# Patient Record
Sex: Male | Born: 1955 | Race: White | Hispanic: No | Marital: Married | Smoking: Never smoker
Health system: Southern US, Community
[De-identification: ages and names within clinical notes are randomized; demographics above are authoritative.]

## PROBLEM LIST (undated history)

## (undated) DIAGNOSIS — E119 Type 2 diabetes mellitus without complications: Secondary | ICD-10-CM

## (undated) DIAGNOSIS — I10 Essential (primary) hypertension: Secondary | ICD-10-CM

## (undated) DIAGNOSIS — T8859XA Other complications of anesthesia, initial encounter: Secondary | ICD-10-CM

## (undated) DIAGNOSIS — R112 Nausea with vomiting, unspecified: Secondary | ICD-10-CM

## (undated) DIAGNOSIS — Z9889 Other specified postprocedural states: Secondary | ICD-10-CM

## (undated) DIAGNOSIS — M5020 Other cervical disc displacement, unspecified cervical region: Secondary | ICD-10-CM

## (undated) DIAGNOSIS — T4145XA Adverse effect of unspecified anesthetic, initial encounter: Secondary | ICD-10-CM

## (undated) DIAGNOSIS — N200 Calculus of kidney: Secondary | ICD-10-CM

## (undated) HISTORY — DX: Other cervical disc displacement, unspecified cervical region: M50.20

## (undated) HISTORY — PX: KIDNEY STONE SURGERY: SHX686

## (undated) HISTORY — DX: Essential (primary) hypertension: I10

## (undated) HISTORY — PX: TONSILLECTOMY: SUR1361

---

## 2005-04-01 ENCOUNTER — Emergency Department: Payer: Self-pay | Admitting: Emergency Medicine

## 2005-11-26 ENCOUNTER — Emergency Department: Payer: Self-pay | Admitting: Emergency Medicine

## 2006-10-28 HISTORY — PX: SPINE SURGERY: SHX786

## 2006-10-28 HISTORY — PX: BACK SURGERY: SHX140

## 2007-08-10 ENCOUNTER — Ambulatory Visit: Payer: Self-pay | Admitting: Chiropractic Medicine

## 2007-09-04 ENCOUNTER — Ambulatory Visit (HOSPITAL_COMMUNITY): Admission: RE | Admit: 2007-09-04 | Discharge: 2007-09-05 | Payer: Self-pay | Admitting: Neurosurgery

## 2007-09-12 ENCOUNTER — Ambulatory Visit: Payer: Self-pay | Admitting: Internal Medicine

## 2007-11-19 ENCOUNTER — Ambulatory Visit: Payer: Self-pay | Admitting: Neurosurgery

## 2008-01-08 ENCOUNTER — Ambulatory Visit: Payer: Self-pay | Admitting: Internal Medicine

## 2008-04-01 IMAGING — CR DG LUMBAR SPINE 2-3V
1 series · 1 of 1 positions shown · non-contrast
Comparison: none

CLINICAL DATA: L5-S1 disc herniation.
 PORTABLE INTRAOPERATIVE LUMBAR SPINE ? 2 VIEW:

[view not recorded]
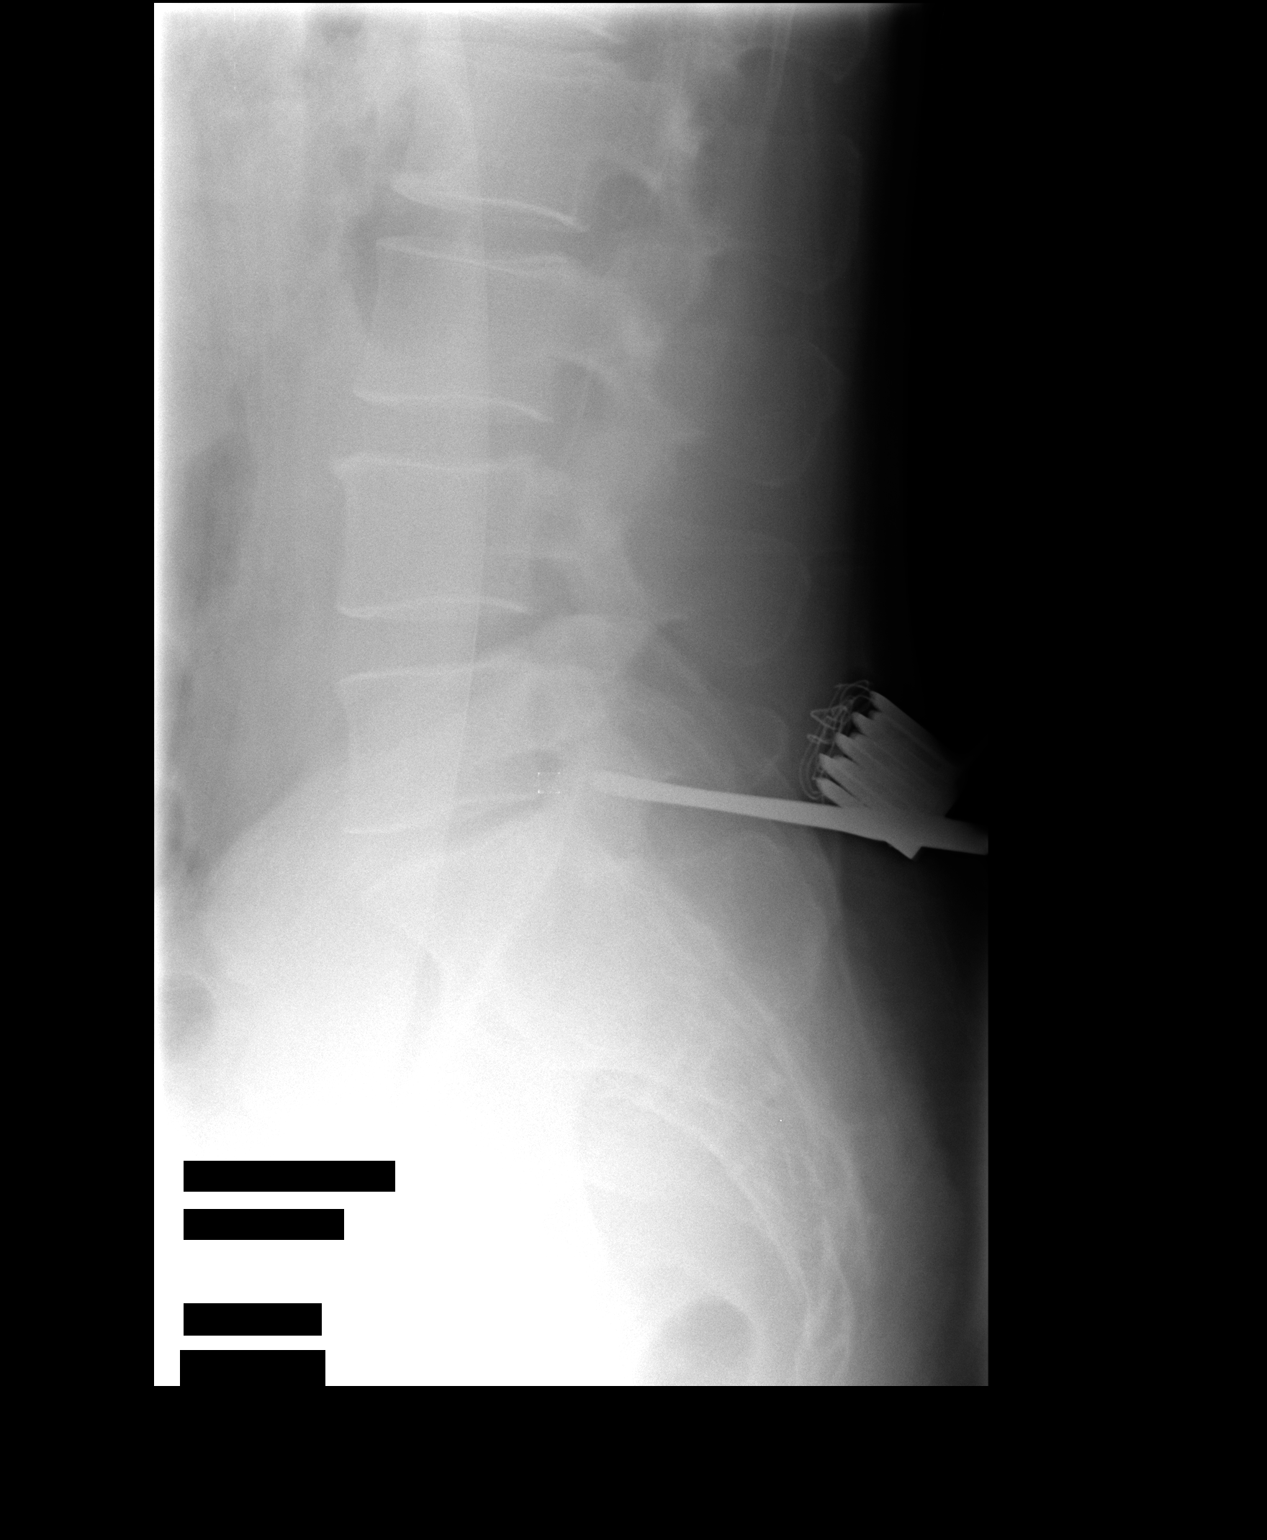

[1 of 1 positions shown; findings below may reference images not displayed]

FINDINGS: Intraoperative crosstable lateral radiograph #1 shows a needle at the interspinous space at the level of L4-5.
 Subsequent intraoperative radiograph #2 shows posterior retractors with a probe overlying the posterior elements at the level of L5-S1.
IMPRESSION: Intraoperative localization of L5-S1.

## 2009-12-16 ENCOUNTER — Ambulatory Visit: Payer: Self-pay | Admitting: Internal Medicine

## 2009-12-29 ENCOUNTER — Ambulatory Visit: Payer: Self-pay | Admitting: Cardiovascular Disease

## 2009-12-29 DIAGNOSIS — I10 Essential (primary) hypertension: Secondary | ICD-10-CM | POA: Insufficient documentation

## 2010-01-01 ENCOUNTER — Telehealth: Payer: Self-pay | Admitting: Cardiovascular Disease

## 2010-01-02 ENCOUNTER — Ambulatory Visit: Payer: Self-pay | Admitting: Cardiovascular Disease

## 2010-01-03 LAB — CONVERTED CEMR LAB
ALT: 47 units/L (ref 0–53)
AST: 28 units/L (ref 0–37)
Albumin: 4.6 g/dL (ref 3.5–5.2)
Alkaline Phosphatase: 93 units/L (ref 39–117)
BUN: 18 mg/dL (ref 6–23)
CO2: 24 meq/L (ref 19–32)
Calcium: 9.3 mg/dL (ref 8.4–10.5)
Chloride: 101 meq/L (ref 96–112)
Cholesterol: 155 mg/dL (ref 0–200)
Creatinine, Ser: 1.04 mg/dL (ref 0.40–1.50)
Glucose, Bld: 81 mg/dL (ref 70–99)
HDL: 36 mg/dL — ABNORMAL LOW (ref >39)
LDL Cholesterol: 88 mg/dL (ref 0–99)
Potassium: 3.9 meq/L (ref 3.5–5.3)
Sodium: 138 meq/L (ref 135–145)
Total Bilirubin: 0.4 mg/dL (ref 0.3–1.2)
Total CHOL/HDL Ratio: 4.3
Total Protein: 7.2 g/dL (ref 6.0–8.3)
Triglycerides: 155 mg/dL — ABNORMAL HIGH (ref <150)
VLDL: 31 mg/dL (ref 0–40)

## 2010-11-27 NOTE — Assessment & Plan Note (Signed)
Summary: NP6/AMD   Visit Type:  New Patient Referring Provider:  self Primary Provider:  Dr Letta Kocher [facility])  CC:  high blood pressure.  History of Present Illness: Pleaseant 55 yo male with history of hypertension, back surgery, urinary tract infection who presents for new patient evaluation for his hypertension.  Mr. Ricky Heath states that he has been doing well, works on the, the third shift. He sleeps from 8 in the morning to noon and then from 6 PM to 10 PM. He is typically very active at work, walks a fair distance. He denies any significant shortness of breath or chest discomfort. He used to work out at [employer] but has not done this in some time. His weight has slowly been increasing as he works sometimes 7 days per week and started to exercise and has fast food.  He is been told by other physicians that his blood pressure is elevated. He has never been on any medication.  Preventive Screening-Counseling & Management  Alcohol-Tobacco     Alcohol drinks/day: 0     Smoking Status: never  Caffeine-Diet-Exercise     Caffeine use/day: no     Does Patient Exercise: yes      Drug Use:  no.    Current Problems (verified): 1)  Unspecified Essential Hypertension  (ICD-401.9)  Current Medications (verified): 1)  None  Allergies (verified): No Known Drug Allergies  Past History:  Past Medical History: Hypertension  Past Surgical History: back surgery in 2008  Family History: adopted  Social History: Full Time Married  Tobacco Use - No.  Alcohol Use - no Regular Exercise - yes Drug Use - no Alcohol drinks/day:  0 Smoking Status:  never Caffeine use/day:  no Does Patient Exercise:  yes Drug Use:  no  Review of Systems  The patient denies fever, weight loss, vision loss, decreased hearing, hoarseness, chest pain, syncope, dyspnea on exertion, peripheral edema, prolonged cough, abdominal pain, incontinence, muscle weakness, depression, and enlarged  lymph nodes.    Vital Signs:  Patient profile:   55 year old male Height:      67 inches Weight:      192 pounds BMI:     30.18 Pulse rate:   84 / minute Pulse rhythm:   regular BP sitting:   162 / 108  (left arm) Cuff size:   regular  Vitals Entered By: Mercer Pod (December 29, 2009 2:21 PM)  Physical Exam  General:  well-appearing middle-aged gentleman in no apparent distress, HEENT exam is benign, oropharynx is clear, neck is supple with no JVP or carotid bruits, heart sounds are regular with normal S1-S2 and no murmurs appreciated, lungs are clear to auscultation with no wheezes Rales, abdominal exam is notable for mild obesity but otherwise benign, no significant lower extremity edema, neurologic exam gross nonfocal and skin is warm and dry.    Impression & Recommendations:  Problem # 1:  UNSPECIFIED ESSENTIAL HYPERTENSION (ICD-401.9) blood pressure has been noted previously by other physicians and confirmed again on today's visit. We have suggested that he start lisinopril HCT 20/12.5 mg daily for one week. If his blood pressure does not improve towards the end of the week, we have suggested that he doubled the dose to 40/25 mg daily. He has a blood pressure cuff at work and his wife is a Teacher, early years/pre and to help with monitoring of his blood pressures.  Preston to call us with his blood pressure numbers over the course of the next  month. We'll see him back in 3 months time for routine followup.  His updated medication list for this problem includes:    Zestoretic 20-12.5 Mg Tabs (Lisinopril-hydrochlorothiazide) .Marland Kitchen... 2 tabs by mouth daily  Problem # 2:  PREVENTIVE HEALTH CARE (ICD-V70.0) we have ordered a cholesterol as this has not been done in some time. He is uncertain of his family history as he was adopted. I suggested that he increase his exercise as he slowly gaining weight. He is not currently a smoker.  Patient Instructions: 1)  Your physician recommends that you  schedule a follow-up appointment in: 3 months 2)  Your physician recommends that you return for a FASTING lipid profile: at your earliest convenience 3)  Your physician has recommended you make the following change in your medication: start lisinopril-hct 40/25 mg- 1 tab daily for 1 week.  If your BP does not regulate, please call. Prescriptions: ZESTORETIC 20-12.5 MG TABS (LISINOPRIL-HYDROCHLOROTHIAZIDE) 2 tabs by mouth daily  #60 x 6   Entered by:   Charlena Cross, RN, BSN   Authorized by:   Dossie Arbour MD   Signed by:   Charlena Cross, RN, BSN on 12/29/2009   Method used:   Electronically to        [facility], [facility] (retail)       [address]       [place], [place]  [address]       Ph: [phone]       Fax: [phone]   RxID:   [id number]

## 2010-11-27 NOTE — Progress Notes (Signed)
Summary: PHI  PHI   Imported By: Harlon Flor 01/01/2010 15:33:56  _____________________________________________________________________  External Attachment:    Type:   Image     Comment:   External Document

## 2010-11-27 NOTE — Progress Notes (Signed)
Summary: PROBLEMS WITH MEDS  Phone Note Call from Patient Call back at Home Phone [phone]   Caller: SELF Call For: Ricky Heath Summary of Call: SINCE STARTING THE LISINOPRIL-FEELS WEAK AND TIRED-PT COMING LATER TODAY FOR LABS AND WOULD JUST LIKE TO BE TALKED TO THEN ABOUT IT-PLEASE DO NOT CALL HIM BECAUSE HE JUST GOT IN FROM WORK AND IS GOING TO BED Initial call taken by: Harlon Flor,  January 01, 2010 9:00 AM  Follow-up for Phone Call        half linsinopril hctz per dr Mariah Milling Follow-up by: Charlena Cross, RN, BSN,  January 01, 2010 3:54 PM

## 2011-01-03 ENCOUNTER — Ambulatory Visit: Payer: Self-pay | Admitting: Family Medicine

## 2011-03-12 NOTE — Op Note (Signed)
NAME:  Ricky Heath, Ricky Heath                 ACCOUNT NO.:  [id number]   MEDICAL RECORD NO.:  [id number]          PATIENT TYPE:  AMB   LOCATION:  SDS                          FACILITY:  [facility]   PHYSICIAN:  Coletta Memos, M.D.     DATE OF BIRTH:  1956/02/07   DATE OF PROCEDURE:  09/04/2007  DATE OF DISCHARGE:                               OPERATIVE REPORT   PREOPERATIVE DIAGNOSIS:  Displaced disk, right L5-S1.   POSTOPERATIVE DIAGNOSIS:  Displaced disk, right L5-S1.   PROCEDURE:  Right L5-S1 semi-hemilaminectomy and diskectomy with  microdissection.   COMPLICATIONS:  None.   SURGEON:  Coletta Memos, M.D.   ASSISTANT:  None.   ANESTHESIA:  General endotracheal.   INDICATIONS:  [name] presented with right lower extremity pain.  He  had a large disk herniation on the right side at L5-S1.   OPERATIVE NOTE:  [name] is brought to the operating room, intubated,  and placed under general anesthetic without difficulty.  He was placed  on a Wilson frame supine, all pressure points being properly padded.  His back was prepped.  He was draped in a sterile fashion.  I  infiltrated 10 mL half percent lidocaine with 1:200,000 strength  epinephrine into the lumbar region.  I opened the skin with a #10 blade,  took this down to the thoracolumbar fascia.  I then exposed the lamina  of L5-S1.  This was confirmed with intraoperative x-ray.  I performed  the semi-hemilaminectomy using a high-speed drill and then removed  ligamentum flavum to expose the thecal sac and the right S1 nerve root.  I retracted that medially and then used bipolar cautery to cauterize  epidural veins.  I then divided those sharply.  I retracted the right S1  nerve root medially and then opened the disk space with a #15 blade.  I  then proceeded to perform a diskectomy.  The sheath of the nerve root  appeared to be opened.  There was no spinal fluid.  The nerve roots  which was visible in the microscope were intact.  I,  therefore,  discontinued to protect that area with the nerve root retractor and  proceeded with the diskectomy.  I performed a thorough decompression  using microdissection.  When that was done, I placed a small piece of  Duragen over the neural sheath opening.  I then closed the wound in  layered fashion using Vicryl sutures after ensuring that I had a good  decompression of the nerve root.  Closed the wound using Vicryl sutures  to reapproximate the thoracolumbar fascia, subcutaneous and subcuticular  layers.  Dermabond used for sterile dressing.  The patient was extubated  after being turned supine.  The patient was turned prone onto a Wilson  frame.          ______________________________  Coletta Memos, M.D.    KC/MEDQ  D:  09/04/2007  T:  09/05/2007  Job:  [id number]

## 2011-05-14 ENCOUNTER — Encounter: Payer: Self-pay | Admitting: Cardiovascular Disease

## 2011-07-12 ENCOUNTER — Ambulatory Visit: Payer: Self-pay | Admitting: Family Medicine

## 2011-08-06 LAB — BASIC METABOLIC PANEL
BUN: 14
CO2: 24
Calcium: 9.6
Chloride: 106
Creatinine, Ser: 0.95
GFR calc Af Amer: >60
GFR calc non Af Amer: >60
Glucose, Bld: 121 — ABNORMAL HIGH
Potassium: 4.1
Sodium: 135

## 2011-08-06 LAB — CBC
HCT: 49.3
Hemoglobin: 16.9
MCHC: 34.2
MCV: 92.8
Platelets: 317
RBC: 5.31
RDW: 12.6
WBC: 8.5

## 2011-08-29 ENCOUNTER — Other Ambulatory Visit (HOSPITAL_COMMUNITY): Payer: Managed Care, Other (non HMO)

## 2011-09-10 ENCOUNTER — Ambulatory Visit (HOSPITAL_COMMUNITY)
Admission: RE | Admit: 2011-09-10 | Payer: Managed Care, Other (non HMO) | Source: Ambulatory Visit | Admitting: Neurosurgery

## 2011-09-10 ENCOUNTER — Encounter (HOSPITAL_COMMUNITY): Admission: RE | Payer: Self-pay | Source: Ambulatory Visit

## 2011-09-10 SURGERY — ANTERIOR CERVICAL DECOMPRESSION/DISCECTOMY FUSION 1 LEVEL
Anesthesia: General

## 2011-10-23 ENCOUNTER — Ambulatory Visit: Payer: Self-pay | Admitting: Internal Medicine

## 2012-03-17 ENCOUNTER — Encounter: Payer: Self-pay | Admitting: Internal Medicine

## 2012-03-17 ENCOUNTER — Ambulatory Visit (INDEPENDENT_AMBULATORY_CARE_PROVIDER_SITE_OTHER): Payer: Managed Care, Other (non HMO) | Admitting: Internal Medicine

## 2012-03-17 VITALS — BP 164/102 | HR 74 | Temp 98.5°F | Resp 16 | Ht 67.0 in | Wt 205.5 lb

## 2012-03-17 DIAGNOSIS — R059 Cough, unspecified: Secondary | ICD-10-CM

## 2012-03-17 DIAGNOSIS — R05 Cough: Secondary | ICD-10-CM

## 2012-03-17 DIAGNOSIS — E669 Obesity, unspecified: Secondary | ICD-10-CM

## 2012-03-17 DIAGNOSIS — I10 Essential (primary) hypertension: Secondary | ICD-10-CM | POA: Insufficient documentation

## 2012-03-17 MED ORDER — AMLODIPINE BESYLATE 5 MG PO TABS: 5.0000 mg | ORAL_TABLET | Freq: Every day | ORAL | Status: DC

## 2012-03-17 MED ORDER — OMEPRAZOLE 40 MG PO CPDR: 40.0000 mg | DELAYED_RELEASE_CAPSULE | Freq: Every day | ORAL | Status: AC

## 2012-03-17 NOTE — Progress Notes (Signed)
Patient ID: Ricky Heath, male   DOB: 08/05/1956, 56 y.o.   MRN: [id number] Patient Active Problem List  Diagnoses  . UNSPECIFIED ESSENTIAL HYPERTENSION  . Cough  . Hypertension  . Obesity (BMI 30-39.9)    Subjective:  CC:   Chief Complaint  Patient presents with  . New Patient    HPI:   Ricky Heath a 56 y.o. male who presents as a new patient.  History of obesity and hypertension both managed last year with weight loss via [employer]  but has regained his weight and is now hypertensive again, in need of medication.  Hasn't had regular health care or a physical in 24 years.  He reports a chronic daily cough which has been present for years and has had no prior chest x ray. It is accompanied by hoarseness.  He has had no prior ENT evaluation, or EGD/colonoscopy. .  Bowel habits have been normal.   Past Medical History  Diagnosis Date  . HTN (hypertension)     Past Surgical History  Procedure Date  . Back surgery 2008   . Spine surgery 2008    L5 herniated disk repair, [place]  . Tonsillectomy          The following portions of the patient's history were reviewed and updated as appropriate: Allergies, current medications, and problem list.    Review of Systems:   12 Pt  review of systems was negative except those addressed in the HPI,     History   Social History  . Marital Status: Married    Spouse Name: N/A    Number of Children: N/A  . Years of Education: N/A   Occupational History  . Not on file.   Social History Main Topics  . Smoking status: Never Smoker   . Smokeless tobacco: Never Used  . Alcohol Use: No  . Drug Use: No  . Sexually Active: Not on file   Other Topics Concern  . Not on file   Social History Narrative   Married; full time; gets regular exercise. Pt is adopted (no family hx)    Objective:  BP 164/102  Pulse 74  Temp(Src) 98.5 F (36.9 C) (Oral)  Resp 16  Ht 5\' 7"  (1.702 m)  Wt 205 lb 8 oz (93.214 kg)  BMI  32.19 kg/m2  SpO2 94%  General appearance: alert, cooperative and appears stated age Ears: normal TM's and external ear canals both ears Throat: lips, mucosa, and tongue normal; teeth and gums normal Neck: no adenopathy, no carotid bruit, supple, symmetrical, trachea midline and thyroid not enlarged, symmetric, no tenderness/mass/nodules Back: symmetric, no curvature. ROM normal. No CVA tenderness. Lungs: clear to auscultation bilaterally Heart: regular rate and rhythm, S1, S2 normal, no murmur, click, rub or gallop Abdomen: soft, non-tender; bowel sounds normal; no masses,  no organomegaly Pulses: 2+ and symmetric Skin: Skin color, texture, turgor normal. No rashes or lesions Lymph nodes: Cervical, supraclavicular, and axillary nodes normal.  Assessment and Plan:  Cough I have recommended a trial of PPI for empiric treatment of reflux,  If the cough resolves, he will need an EGD given his several years history of cough. CXR also ordered.   Hypertension Trial of amlodipine pending evaluation of kidney function and other medical issues.   Obesity (BMI 30-39.9) I have addressed  BMI and recommended a low glycemic index diet utilizing smaller more frequent meals to increase metabolism.  I have also recommended that patient start exercising with a goal  of 30 minutes of aerobic exercise a minimum of 5 days per week. Screening for lipid disorders, thyroid and diabetes to be done today.       Updated Medication List Outpatient Encounter Prescriptions as of 03/17/2012  Medication Sig Dispense Refill  . amLODipine (NORVASC) 5 MG tablet Take 1 tablet (5 mg total) by mouth daily.  30 tablet  3  . omeprazole (PRILOSEC) 40 MG capsule Take 1 capsule (40 mg total) by mouth daily.  30 capsule  3  . DISCONTD: lisinopril-hydrochlorothiazide (PRINZIDE,ZESTORETIC) 20-12.5 MG per tablet Take 2 tablets by mouth daily.           No orders of the defined types were placed in this encounter.    No  Follow-up on file.

## 2012-03-17 NOTE — Patient Instructions (Signed)
I am starting you on a medicine for reflux (omeprazole) and blood pressure (amlodipine)  Take both one time daily every morning  I will get you scheduled to see a GI doctor for your EGD and colonoscopy   We will get fasting labs prior your physical with me (scheudle both for an afternoon so you can fast while you sleep)  Fasting means 8 hours with only water to eat or drink

## 2012-03-18 DIAGNOSIS — E669 Obesity, unspecified: Secondary | ICD-10-CM | POA: Insufficient documentation

## 2012-03-18 NOTE — Assessment & Plan Note (Signed)
I have addressed  BMI and recommended a low glycemic index diet utilizing smaller more frequent meals to increase metabolism.  I have also recommended that patient start exercising with a goal of 30 minutes of aerobic exercise a minimum of 5 days per week. Screening for lipid disorders, thyroid and diabetes to be done today.   

## 2012-03-18 NOTE — Assessment & Plan Note (Signed)
I have recommended a trial of PPI for empiric treatment of reflux,  If the cough resolves, he will need an EGD given his several years history of cough. CXR also ordered.

## 2012-03-18 NOTE — Assessment & Plan Note (Signed)
Trial of amlodipine pending evaluation of kidney function and other medical issues.

## 2012-03-19 ENCOUNTER — Telehealth: Payer: Self-pay | Admitting: Internal Medicine

## 2012-03-19 NOTE — Telephone Encounter (Signed)
Patient called and stated since starting both the omeprazole and amlodipine he has developed a headache on his left side.  He didn't know which one is causing the headache and wanted to know what he should do.  Please advise.

## 2012-03-19 NOTE — Telephone Encounter (Signed)
Patient notified.  He will call back on Tuesday to let us know.

## 2012-03-19 NOTE — Telephone Encounter (Signed)
Have his blood pressure checked to make sure it is not too low (only stopp amlodipine if bp is less than 100/600.  Otherwise stop the omeprazole for a few days to see if it is the omeprazole.

## 2012-11-02 ENCOUNTER — Ambulatory Visit: Payer: Self-pay | Admitting: Emergency Medicine

## 2013-05-14 ENCOUNTER — Ambulatory Visit: Payer: Self-pay | Admitting: Family Medicine

## 2013-05-14 LAB — URINALYSIS, COMPLETE
Bacteria: NEGATIVE
Bilirubin,UR: NEGATIVE
Glucose,UR: NEGATIVE mg/dL (ref 0–75)
Ketone: NEGATIVE
Leukocyte Esterase: NEGATIVE
Nitrite: NEGATIVE
Ph: 6 (ref 4.5–8.0)
Specific Gravity: 1.025 (ref 1.003–1.030)

## 2013-05-16 LAB — URINE CULTURE

## 2013-05-23 ENCOUNTER — Emergency Department: Payer: Self-pay | Admitting: Emergency Medicine

## 2013-05-23 LAB — COMPREHENSIVE METABOLIC PANEL
Albumin: 3.8 g/dL (ref 3.4–5.0)
Alkaline Phosphatase: 123 U/L (ref 50–136)
Anion Gap: 5 — ABNORMAL LOW (ref 7–16)
BUN: 19 mg/dL — ABNORMAL HIGH (ref 7–18)
Bilirubin,Total: 0.7 mg/dL (ref 0.2–1.0)
Calcium, Total: 9.1 mg/dL (ref 8.5–10.1)
Chloride: 103 mmol/L (ref 98–107)
Co2: 29 mmol/L (ref 21–32)
Creatinine: 1.82 mg/dL — ABNORMAL HIGH (ref 0.60–1.30)
EGFR (African American): 48 — ABNORMAL LOW
EGFR (Non-African Amer.): 41 — ABNORMAL LOW
Glucose: 87 mg/dL (ref 65–99)
Osmolality: 275 (ref 275–301)
Potassium: 3.8 mmol/L (ref 3.5–5.1)
SGOT(AST): 24 U/L (ref 15–37)
SGPT (ALT): 50 U/L (ref 12–78)
Sodium: 137 mmol/L (ref 136–145)
Total Protein: 8.1 g/dL (ref 6.4–8.2)

## 2013-05-23 LAB — CBC WITH DIFFERENTIAL/PLATELET
Basophil #: 0.1 10*3/uL (ref 0.0–0.1)
Basophil %: 0.6 %
Eosinophil #: 0.2 10*3/uL (ref 0.0–0.7)
Eosinophil %: 1.3 %
HCT: 49.2 % (ref 40.0–52.0)
HGB: 16.2 g/dL (ref 13.0–18.0)
Lymphocyte #: 3.4 10*3/uL (ref 1.0–3.6)
Lymphocyte %: 22.6 %
MCH: 30.2 pg (ref 26.0–34.0)
MCHC: 33 g/dL (ref 32.0–36.0)
MCV: 92 fL (ref 80–100)
Monocyte #: 2 x10 3/mm — ABNORMAL HIGH (ref 0.2–1.0)
Monocyte %: 13.6 %
Neutrophil #: 9.3 10*3/uL — ABNORMAL HIGH (ref 1.4–6.5)
Neutrophil %: 61.9 %
Platelet: 312 10*3/uL (ref 150–440)
RBC: 5.37 10*6/uL (ref 4.40–5.90)
RDW: 12.6 % (ref 11.5–14.5)
WBC: 15 10*3/uL — ABNORMAL HIGH (ref 3.8–10.6)

## 2013-05-23 LAB — URINALYSIS, COMPLETE
Bilirubin,UR: NEGATIVE
Glucose,UR: NEGATIVE mg/dL (ref 0–75)
Leukocyte Esterase: NEGATIVE
Nitrite: NEGATIVE
Ph: 6 (ref 4.5–8.0)
Protein: NEGATIVE
RBC,UR: 9 /HPF (ref 0–5)
Specific Gravity: 1.015 (ref 1.003–1.030)
Squamous Epithelial: NONE SEEN
WBC UR: 1 /HPF (ref 0–5)

## 2013-05-24 ENCOUNTER — Ambulatory Visit: Payer: Self-pay | Admitting: Urology

## 2013-05-24 DIAGNOSIS — N138 Other obstructive and reflux uropathy: Secondary | ICD-10-CM | POA: Insufficient documentation

## 2013-05-24 DIAGNOSIS — N200 Calculus of kidney: Secondary | ICD-10-CM | POA: Insufficient documentation

## 2013-05-24 DIAGNOSIS — N1330 Unspecified hydronephrosis: Secondary | ICD-10-CM | POA: Insufficient documentation

## 2013-05-24 DIAGNOSIS — N23 Unspecified renal colic: Secondary | ICD-10-CM | POA: Insufficient documentation

## 2013-05-24 DIAGNOSIS — N401 Enlarged prostate with lower urinary tract symptoms: Secondary | ICD-10-CM | POA: Insufficient documentation

## 2013-06-01 ENCOUNTER — Ambulatory Visit: Payer: Self-pay | Admitting: Urology

## 2013-09-14 ENCOUNTER — Ambulatory Visit: Payer: Self-pay | Admitting: Emergency Medicine

## 2014-06-01 ENCOUNTER — Encounter: Payer: Self-pay | Admitting: Orthopedic Surgery

## 2014-06-10 ENCOUNTER — Ambulatory Visit: Payer: Self-pay | Admitting: Family Medicine

## 2014-06-24 ENCOUNTER — Ambulatory Visit: Payer: Self-pay | Admitting: Neurosurgery

## 2014-06-28 ENCOUNTER — Other Ambulatory Visit: Payer: Self-pay | Admitting: Neurosurgery

## 2014-06-29 ENCOUNTER — Encounter (HOSPITAL_COMMUNITY): Payer: Self-pay | Admitting: *Deleted

## 2014-06-29 NOTE — Progress Notes (Signed)
Pre-op orders in EPIC, not signed. Called and spoke with Darl Pikes at Dr. Sueanne Margarita office to have Dr. Franky Macho sign orders.

## 2014-06-29 NOTE — Progress Notes (Signed)
Pt does not have a PCP at the present time. Hx of Hypertension, has been off meds for over a year because of weight loss. States he's gained the weight back and needs to get a PCP.

## 2014-06-30 ENCOUNTER — Ambulatory Visit (HOSPITAL_COMMUNITY): Payer: Managed Care, Other (non HMO)

## 2014-06-30 ENCOUNTER — Encounter (HOSPITAL_COMMUNITY): Payer: Managed Care, Other (non HMO) | Admitting: Anesthesiology

## 2014-06-30 ENCOUNTER — Ambulatory Visit (HOSPITAL_COMMUNITY): Payer: Managed Care, Other (non HMO) | Admitting: Anesthesiology

## 2014-06-30 ENCOUNTER — Encounter (HOSPITAL_COMMUNITY): Payer: Self-pay | Admitting: Anesthesiology

## 2014-06-30 ENCOUNTER — Encounter (HOSPITAL_COMMUNITY)
Admission: RE | Disposition: A | Discharge status: Home / Self Care | Payer: Managed Care, Other (non HMO) | Source: Ambulatory Visit | Attending: Neurosurgery

## 2014-06-30 ENCOUNTER — Ambulatory Visit (HOSPITAL_COMMUNITY)
Admission: RE | Admit: 2014-06-30 | Discharge: 2014-07-01 | Disposition: A | Discharge status: Home / Self Care | Payer: Managed Care, Other (non HMO) | Source: Ambulatory Visit | Attending: Neurosurgery | Admitting: Neurosurgery

## 2014-06-30 DIAGNOSIS — I10 Essential (primary) hypertension: Secondary | ICD-10-CM | POA: Insufficient documentation

## 2014-06-30 DIAGNOSIS — M5126 Other intervertebral disc displacement, lumbar region: Secondary | ICD-10-CM | POA: Insufficient documentation

## 2014-06-30 HISTORY — DX: Nausea with vomiting, unspecified: R11.2

## 2014-06-30 HISTORY — PX: LUMBAR LAMINECTOMY/DECOMPRESSION MICRODISCECTOMY: SHX5026

## 2014-06-30 HISTORY — DX: Adverse effect of unspecified anesthetic, initial encounter: T41.45XA

## 2014-06-30 HISTORY — DX: Other specified postprocedural states: Z98.890

## 2014-06-30 HISTORY — DX: Calculus of kidney: N20.0

## 2014-06-30 HISTORY — DX: Other complications of anesthesia, initial encounter: T88.59XA

## 2014-06-30 LAB — CBC
HCT: 50.9 % (ref 39.0–52.0)
Hemoglobin: 17.5 g/dL — ABNORMAL HIGH (ref 13.0–17.0)
MCH: 31.9 pg (ref 26.0–34.0)
MCHC: 34.4 g/dL (ref 30.0–36.0)
MCV: 92.9 fL (ref 78.0–100.0)
Platelets: 272 10*3/uL (ref 150–400)
RBC: 5.48 MIL/uL (ref 4.22–5.81)
RDW: 12.7 % (ref 11.5–15.5)
WBC: 8.7 10*3/uL (ref 4.0–10.5)

## 2014-06-30 LAB — SURGICAL PCR SCREEN
MRSA, PCR: NEGATIVE
Staphylococcus aureus: POSITIVE — AB

## 2014-06-30 LAB — BASIC METABOLIC PANEL
Anion gap: 12 (ref 5–15)
BUN: 17 mg/dL (ref 6–23)
CO2: 28 mEq/L (ref 19–32)
Calcium: 9.6 mg/dL (ref 8.4–10.5)
Chloride: 102 mEq/L (ref 96–112)
Creatinine, Ser: 1.07 mg/dL (ref 0.50–1.35)
GFR calc Af Amer: 88 mL/min — ABNORMAL LOW (ref >90)
GFR calc non Af Amer: 76 mL/min — ABNORMAL LOW (ref >90)
Glucose, Bld: 166 mg/dL — ABNORMAL HIGH (ref 70–99)
Potassium: 4.4 mEq/L (ref 3.7–5.3)
Sodium: 142 mEq/L (ref 137–147)

## 2014-06-30 SURGERY — LUMBAR LAMINECTOMY/DECOMPRESSION MICRODISCECTOMY 1 LEVEL
Anesthesia: General | Site: Spine Lumbar

## 2014-06-30 MED ORDER — GLYCOPYRROLATE 0.2 MG/ML IJ SOLN
INTRAMUSCULAR | Status: AC
  Filled 2014-06-30: qty 4

## 2014-06-30 MED ORDER — MIDAZOLAM HCL 2 MG/2ML IJ SOLN
INTRAMUSCULAR | Status: AC
  Filled 2014-06-30: qty 2

## 2014-06-30 MED ORDER — ROCURONIUM BROMIDE 100 MG/10ML IV SOLN
INTRAVENOUS | Status: DC | PRN
  Administered 2014-06-30: 35 mg via INTRAVENOUS

## 2014-06-30 MED ORDER — SENNOSIDES-DOCUSATE SODIUM 8.6-50 MG PO TABS
1.0000 | ORAL_TABLET | Freq: Every evening | ORAL | Status: DC | PRN
  Filled 2014-06-30: qty 1

## 2014-06-30 MED ORDER — AMLODIPINE BESYLATE 5 MG PO TABS
5.0000 mg | ORAL_TABLET | Freq: Every day | ORAL | Status: DC
  Administered 2014-06-30 – 2014-07-01 (×2): 5 mg via ORAL
  Filled 2014-06-30 (×2): qty 1

## 2014-06-30 MED ORDER — LABETALOL HCL 5 MG/ML IV SOLN
5.0000 mg | Freq: Once | INTRAVENOUS | Status: AC
  Administered 2014-06-30: 5 mg via INTRAVENOUS

## 2014-06-30 MED ORDER — SUCCINYLCHOLINE CHLORIDE 20 MG/ML IJ SOLN
INTRAMUSCULAR | Status: DC | PRN
  Administered 2014-06-30: 140 mg via INTRAVENOUS

## 2014-06-30 MED ORDER — ONDANSETRON HCL 4 MG/2ML IJ SOLN
INTRAMUSCULAR | Status: DC | PRN
  Administered 2014-06-30: 4 mg via INTRAVENOUS

## 2014-06-30 MED ORDER — LABETALOL HCL 5 MG/ML IV SOLN
5.0000 mg | Freq: Once | INTRAVENOUS | Status: DC
Start: 2014-06-30 — End: 2014-07-01
  Filled 2014-06-30: qty 4

## 2014-06-30 MED ORDER — GLYCOPYRROLATE 0.2 MG/ML IJ SOLN
INTRAMUSCULAR | Status: DC | PRN
  Administered 2014-06-30: .8 mg via INTRAVENOUS

## 2014-06-30 MED ORDER — OXYCODONE-ACETAMINOPHEN 5-325 MG PO TABS
1.0000 | ORAL_TABLET | ORAL | Status: DC | PRN
  Administered 2014-06-30: 2 via ORAL

## 2014-06-30 MED ORDER — SUCCINYLCHOLINE CHLORIDE 20 MG/ML IJ SOLN
INTRAMUSCULAR | Status: AC
  Filled 2014-06-30: qty 1

## 2014-06-30 MED ORDER — SODIUM CHLORIDE 0.9 % IJ SOLN
INTRAMUSCULAR | Status: AC
  Filled 2014-06-30: qty 10

## 2014-06-30 MED ORDER — PANTOPRAZOLE SODIUM 40 MG PO TBEC
80.0000 mg | DELAYED_RELEASE_TABLET | Freq: Every day | ORAL | Status: DC
  Administered 2014-06-30 – 2014-07-01 (×2): 80 mg via ORAL
  Filled 2014-06-30 (×2): qty 2

## 2014-06-30 MED ORDER — LIDOCAINE-EPINEPHRINE 0.5 %-1:200000 IJ SOLN
INTRAMUSCULAR | Status: DC | PRN
  Administered 2014-06-30 (×2): 10 mL

## 2014-06-30 MED ORDER — PROPOFOL 10 MG/ML IV BOLUS
INTRAVENOUS | Status: AC
  Filled 2014-06-30: qty 20

## 2014-06-30 MED ORDER — PHENYLEPHRINE HCL 10 MG/ML IJ SOLN
INTRAMUSCULAR | Status: DC | PRN
  Administered 2014-06-30: 120 ug via INTRAVENOUS
  Administered 2014-06-30: 80 ug via INTRAVENOUS

## 2014-06-30 MED ORDER — HYDROCODONE-ACETAMINOPHEN 5-325 MG PO TABS
1.0000 | ORAL_TABLET | ORAL | Status: DC | PRN
  Administered 2014-06-30 – 2014-07-01 (×2): 2 via ORAL
  Filled 2014-06-30 (×2): qty 2

## 2014-06-30 MED ORDER — OXYCODONE-ACETAMINOPHEN 5-325 MG PO TABS
ORAL_TABLET | ORAL | Status: AC
  Administered 2014-06-30: 2 via ORAL
  Filled 2014-06-30: qty 2

## 2014-06-30 MED ORDER — ACETAMINOPHEN 650 MG RE SUPP: 650.0000 mg | RECTAL | Status: DC | PRN

## 2014-06-30 MED ORDER — EPHEDRINE SULFATE 50 MG/ML IJ SOLN
INTRAMUSCULAR | Status: AC
  Filled 2014-06-30: qty 1

## 2014-06-30 MED ORDER — ONDANSETRON HCL 4 MG/2ML IJ SOLN
INTRAMUSCULAR | Status: AC
  Filled 2014-06-30: qty 2

## 2014-06-30 MED ORDER — SODIUM CHLORIDE 0.9 % IJ SOLN
3.0000 mL | Freq: Two times a day (BID) | INTRAMUSCULAR | Status: DC
  Administered 2014-06-30: 3 mL via INTRAVENOUS

## 2014-06-30 MED ORDER — NEOSTIGMINE METHYLSULFATE 10 MG/10ML IV SOLN
INTRAVENOUS | Status: DC | PRN
  Administered 2014-06-30: 4 mg via INTRAVENOUS

## 2014-06-30 MED ORDER — NEOSTIGMINE METHYLSULFATE 10 MG/10ML IV SOLN
INTRAVENOUS | Status: AC
  Filled 2014-06-30: qty 1

## 2014-06-30 MED ORDER — PROMETHAZINE HCL 25 MG PO TABS: 12.5000 mg | ORAL_TABLET | Freq: Four times a day (QID) | ORAL | Status: DC | PRN

## 2014-06-30 MED ORDER — POTASSIUM CHLORIDE IN NACL 20-0.9 MEQ/L-% IV SOLN
INTRAVENOUS | Status: DC
  Filled 2014-06-30 (×3): qty 1000

## 2014-06-30 MED ORDER — SENNA 8.6 MG PO TABS
1.0000 | ORAL_TABLET | Freq: Two times a day (BID) | ORAL | Status: DC
  Administered 2014-06-30 – 2014-07-01 (×3): 8.6 mg via ORAL
  Filled 2014-06-30 (×5): qty 1

## 2014-06-30 MED ORDER — MIDAZOLAM HCL 5 MG/5ML IJ SOLN
INTRAMUSCULAR | Status: DC | PRN
  Administered 2014-06-30: 2 mg via INTRAVENOUS

## 2014-06-30 MED ORDER — ROCURONIUM BROMIDE 50 MG/5ML IV SOLN
INTRAVENOUS | Status: AC
  Filled 2014-06-30: qty 1

## 2014-06-30 MED ORDER — DOCUSATE SODIUM 100 MG PO CAPS
100.0000 mg | ORAL_CAPSULE | Freq: Two times a day (BID) | ORAL | Status: DC
  Administered 2014-06-30 – 2014-07-01 (×3): 100 mg via ORAL
  Filled 2014-06-30 (×4): qty 1

## 2014-06-30 MED ORDER — SODIUM CHLORIDE 0.9 % IV SOLN: 250.0000 mL | INTRAVENOUS | Status: DC

## 2014-06-30 MED ORDER — LACTATED RINGERS IV SOLN
INTRAVENOUS | Status: DC | PRN
  Administered 2014-06-30 (×2): via INTRAVENOUS

## 2014-06-30 MED ORDER — PROPOFOL 10 MG/ML IV BOLUS
INTRAVENOUS | Status: DC | PRN
  Administered 2014-06-30: 200 mg via INTRAVENOUS

## 2014-06-30 MED ORDER — KETOROLAC TROMETHAMINE 30 MG/ML IJ SOLN
30.0000 mg | Freq: Four times a day (QID) | INTRAMUSCULAR | Status: DC
Start: 2014-06-30 — End: 2014-07-01
  Administered 2014-06-30 – 2014-07-01 (×4): 30 mg via INTRAVENOUS
  Filled 2014-06-30 (×7): qty 1

## 2014-06-30 MED ORDER — FENTANYL CITRATE 0.05 MG/ML IJ SOLN
INTRAMUSCULAR | Status: AC
  Filled 2014-06-30: qty 5

## 2014-06-30 MED ORDER — LIDOCAINE HCL (CARDIAC) 20 MG/ML IV SOLN
INTRAVENOUS | Status: DC | PRN
  Administered 2014-06-30: 100 mg via INTRAVENOUS

## 2014-06-30 MED ORDER — CEFAZOLIN SODIUM-DEXTROSE 2-3 GM-% IV SOLR
INTRAVENOUS | Status: AC
  Administered 2014-06-30: 2 g via INTRAVENOUS
  Filled 2014-06-30: qty 50

## 2014-06-30 MED ORDER — ACETAMINOPHEN 325 MG PO TABS: 650.0000 mg | ORAL_TABLET | ORAL | Status: DC | PRN

## 2014-06-30 MED ORDER — HYDROMORPHONE HCL PF 1 MG/ML IJ SOLN
INTRAMUSCULAR | Status: AC
  Administered 2014-06-30: 0.5 mg via INTRAVENOUS
  Filled 2014-06-30: qty 2

## 2014-06-30 MED ORDER — TAMSULOSIN HCL 0.4 MG PO CAPS
0.4000 mg | ORAL_CAPSULE | Freq: Every day | ORAL | Status: DC
  Administered 2014-06-30 – 2014-07-01 (×2): 0.4 mg via ORAL
  Filled 2014-06-30 (×2): qty 1

## 2014-06-30 MED ORDER — MENTHOL 3 MG MT LOZG
1.0000 | LOZENGE | OROMUCOSAL | Status: DC | PRN
Start: 2014-06-30 — End: 2014-07-01

## 2014-06-30 MED ORDER — ARTIFICIAL TEARS OP OINT
TOPICAL_OINTMENT | OPHTHALMIC | Status: AC
  Filled 2014-06-30: qty 3.5

## 2014-06-30 MED ORDER — LACTATED RINGERS IV SOLN: INTRAVENOUS | Status: DC

## 2014-06-30 MED ORDER — SODIUM CHLORIDE 0.9 % IR SOLN
Status: DC | PRN
  Administered 2014-06-30: 1

## 2014-06-30 MED ORDER — SCOPOLAMINE 1 MG/3DAYS TD PT72
MEDICATED_PATCH | TRANSDERMAL | Status: AC
  Filled 2014-06-30: qty 1

## 2014-06-30 MED ORDER — HYDROMORPHONE HCL PF 1 MG/ML IJ SOLN
0.2500 mg | INTRAMUSCULAR | Status: DC | PRN
  Administered 2014-06-30 (×4): 0.5 mg via INTRAVENOUS

## 2014-06-30 MED ORDER — KETOROLAC TROMETHAMINE 30 MG/ML IJ SOLN
INTRAMUSCULAR | Status: AC
  Administered 2014-06-30: 30 mg via INTRAVENOUS
  Filled 2014-06-30: qty 1

## 2014-06-30 MED ORDER — METOCLOPRAMIDE HCL 5 MG/ML IJ SOLN
INTRAMUSCULAR | Status: AC
  Filled 2014-06-30: qty 2

## 2014-06-30 MED ORDER — FENTANYL CITRATE 0.05 MG/ML IJ SOLN
INTRAMUSCULAR | Status: DC | PRN
  Administered 2014-06-30: 200 ug via INTRAVENOUS
  Administered 2014-06-30 (×2): 50 ug via INTRAVENOUS

## 2014-06-30 MED ORDER — THROMBIN 5000 UNITS EX SOLR
CUTANEOUS | Status: DC | PRN
  Administered 2014-06-30: 5000 [IU] via TOPICAL

## 2014-06-30 MED ORDER — CLONIDINE HCL 0.1 MG PO TABS
0.1000 mg | ORAL_TABLET | Freq: Three times a day (TID) | ORAL | Status: DC
  Administered 2014-06-30 – 2014-07-01 (×2): 0.1 mg via ORAL
  Filled 2014-06-30 (×4): qty 1

## 2014-06-30 MED ORDER — LABETALOL HCL 5 MG/ML IV SOLN
INTRAVENOUS | Status: AC
  Administered 2014-06-30: 5 mg via INTRAVENOUS
  Filled 2014-06-30: qty 4

## 2014-06-30 MED ORDER — HEMOSTATIC AGENTS (NO CHARGE) OPTIME
TOPICAL | Status: DC | PRN
  Administered 2014-06-30: 1 via TOPICAL

## 2014-06-30 MED ORDER — ARTIFICIAL TEARS OP OINT
TOPICAL_OINTMENT | OPHTHALMIC | Status: DC | PRN
  Administered 2014-06-30: 1 via OPHTHALMIC

## 2014-06-30 MED ORDER — MUPIROCIN 2 % EX OINT
TOPICAL_OINTMENT | CUTANEOUS | Status: AC
Start: 2014-06-30 — End: 2014-06-30
  Filled 2014-06-30: qty 22

## 2014-06-30 MED ORDER — POLYETHYLENE GLYCOL 3350 17 G PO PACK
17.0000 g | PACK | Freq: Every day | ORAL | Status: DC
  Administered 2014-06-30 – 2014-07-01 (×2): 17 g via ORAL
  Filled 2014-06-30 (×2): qty 1

## 2014-06-30 MED ORDER — EPHEDRINE SULFATE 50 MG/ML IJ SOLN
INTRAMUSCULAR | Status: DC | PRN
  Administered 2014-06-30 (×3): 10 mg via INTRAVENOUS

## 2014-06-30 MED ORDER — CYCLOBENZAPRINE HCL 10 MG PO TABS
ORAL_TABLET | ORAL | Status: AC
  Administered 2014-06-30: 10 mg via ORAL
  Filled 2014-06-30: qty 1

## 2014-06-30 MED ORDER — MUPIROCIN 2 % EX OINT
1.0000 "application " | TOPICAL_OINTMENT | Freq: Once | CUTANEOUS | Status: AC
  Administered 2014-06-30: 1 via TOPICAL

## 2014-06-30 MED ORDER — SODIUM CHLORIDE 0.9 % IJ SOLN: 3.0000 mL | INTRAMUSCULAR | Status: DC | PRN

## 2014-06-30 MED ORDER — MUPIROCIN 2 % EX OINT
1.0000 | TOPICAL_OINTMENT | Freq: Two times a day (BID) | CUTANEOUS | Status: DC
Start: 2014-06-30 — End: 2014-07-01
  Administered 2014-06-30 – 2014-07-01 (×3): 1 via NASAL
  Filled 2014-06-30: qty 22

## 2014-06-30 MED ORDER — PHENYLEPHRINE 40 MCG/ML (10ML) SYRINGE FOR IV PUSH (FOR BLOOD PRESSURE SUPPORT)
PREFILLED_SYRINGE | INTRAVENOUS | Status: AC
  Filled 2014-06-30: qty 10

## 2014-06-30 MED ORDER — HYDROMORPHONE HCL PF 1 MG/ML IJ SOLN: 0.5000 mg | INTRAMUSCULAR | Status: DC | PRN

## 2014-06-30 MED ORDER — PHENOL 1.4 % MT LIQD: 1.0000 | OROMUCOSAL | Status: DC | PRN

## 2014-06-30 MED ORDER — ONDANSETRON HCL 4 MG/2ML IJ SOLN: 4.0000 mg | INTRAMUSCULAR | Status: DC | PRN

## 2014-06-30 MED ORDER — CYCLOBENZAPRINE HCL 10 MG PO TABS
10.0000 mg | ORAL_TABLET | Freq: Three times a day (TID) | ORAL | Status: DC | PRN
  Administered 2014-06-30 (×2): 10 mg via ORAL
  Filled 2014-06-30: qty 1

## 2014-06-30 SURGICAL SUPPLY — 55 items
BAG DECANTER FOR FLEXI CONT (MISCELLANEOUS) ×2 IMPLANT
BENZOIN TINCTURE PRP APPL 2/3 (GAUZE/BANDAGES/DRESSINGS) IMPLANT
BLADE SURG ROTATE 9660 (MISCELLANEOUS) IMPLANT
BUR MATCHSTICK NEURO 3.0 LAGG (BURR) ×2 IMPLANT
CANISTER SUCT 3000ML (MISCELLANEOUS) ×2 IMPLANT
CONT SPEC 4OZ CLIKSEAL STRL BL (MISCELLANEOUS) ×2 IMPLANT
DECANTER SPIKE VIAL GLASS SM (MISCELLANEOUS) ×2 IMPLANT
DERMABOND ADHESIVE PROPEN (GAUZE/BANDAGES/DRESSINGS) ×1
DERMABOND ADVANCED (GAUZE/BANDAGES/DRESSINGS) ×1
DERMABOND ADVANCED .7 DNX12 (GAUZE/BANDAGES/DRESSINGS) ×1 IMPLANT
DERMABOND ADVANCED .7 DNX6 (GAUZE/BANDAGES/DRESSINGS) ×1 IMPLANT
DRAPE LAPAROTOMY 100X72X124 (DRAPES) ×2 IMPLANT
DRAPE MICROSCOPE LEICA (MISCELLANEOUS) ×2 IMPLANT
DRAPE POUCH INSTRU U-SHP 10X18 (DRAPES) ×2 IMPLANT
DRAPE SURG 17X23 STRL (DRAPES) ×2 IMPLANT
DURAPREP 26ML APPLICATOR (WOUND CARE) ×2 IMPLANT
ELECT REM PT RETURN 9FT ADLT (ELECTROSURGICAL) ×2
ELECTRODE REM PT RTRN 9FT ADLT (ELECTROSURGICAL) ×1 IMPLANT
GAUZE SPONGE 4X4 12PLY STRL (GAUZE/BANDAGES/DRESSINGS) IMPLANT
GAUZE SPONGE 4X4 16PLY XRAY LF (GAUZE/BANDAGES/DRESSINGS) IMPLANT
GLOVE BIOGEL PI IND STRL 7.5 (GLOVE) ×2 IMPLANT
GLOVE BIOGEL PI IND STRL 8 (GLOVE) ×1 IMPLANT
GLOVE BIOGEL PI INDICATOR 7.5 (GLOVE) ×2
GLOVE BIOGEL PI INDICATOR 8 (GLOVE) ×1
GLOVE ECLIPSE 6.5 STRL STRAW (GLOVE) ×4 IMPLANT
GLOVE EXAM NITRILE LRG STRL (GLOVE) IMPLANT
GLOVE EXAM NITRILE MD LF STRL (GLOVE) IMPLANT
GLOVE EXAM NITRILE XL STR (GLOVE) IMPLANT
GLOVE EXAM NITRILE XS STR PU (GLOVE) IMPLANT
GLOVE SS BIOGEL STRL SZ 7 (GLOVE) ×2 IMPLANT
GLOVE SUPERSENSE BIOGEL SZ 7 (GLOVE) ×2
GOWN STRL REUS W/ TWL LRG LVL3 (GOWN DISPOSABLE) ×3 IMPLANT
GOWN STRL REUS W/ TWL XL LVL3 (GOWN DISPOSABLE) IMPLANT
GOWN STRL REUS W/TWL 2XL LVL3 (GOWN DISPOSABLE) IMPLANT
GOWN STRL REUS W/TWL LRG LVL3 (GOWN DISPOSABLE) ×3
GOWN STRL REUS W/TWL XL LVL3 (GOWN DISPOSABLE)
KIT BASIN OR (CUSTOM PROCEDURE TRAY) ×2 IMPLANT
KIT ROOM TURNOVER OR (KITS) ×2 IMPLANT
NEEDLE HYPO 25X1 1.5 SAFETY (NEEDLE) ×2 IMPLANT
NEEDLE SPNL 18GX3.5 QUINCKE PK (NEEDLE) IMPLANT
NS IRRIG 1000ML POUR BTL (IV SOLUTION) ×2 IMPLANT
PACK LAMINECTOMY NEURO (CUSTOM PROCEDURE TRAY) ×2 IMPLANT
PAD ARMBOARD 7.5X6 YLW CONV (MISCELLANEOUS) ×6 IMPLANT
RUBBERBAND STERILE (MISCELLANEOUS) ×4 IMPLANT
SPONGE LAP 4X18 X RAY DECT (DISPOSABLE) IMPLANT
SPONGE SURGIFOAM ABS GEL SZ50 (HEMOSTASIS) ×2 IMPLANT
STRIP CLOSURE SKIN 1/2X4 (GAUZE/BANDAGES/DRESSINGS) IMPLANT
SUT VIC AB 0 CT1 18XCR BRD8 (SUTURE) ×1 IMPLANT
SUT VIC AB 0 CT1 8-18 (SUTURE) ×1
SUT VIC AB 2-0 CT1 18 (SUTURE) ×2 IMPLANT
SUT VIC AB 3-0 SH 8-18 (SUTURE) ×2 IMPLANT
SYR 20ML ECCENTRIC (SYRINGE) ×2 IMPLANT
TOWEL OR 17X24 6PK STRL BLUE (TOWEL DISPOSABLE) ×2 IMPLANT
TOWEL OR 17X26 10 PK STRL BLUE (TOWEL DISPOSABLE) ×2 IMPLANT
WATER STERILE IRR 1000ML POUR (IV SOLUTION) ×2 IMPLANT

## 2014-06-30 NOTE — Transfer of Care (Signed)
Immediate Anesthesia Transfer of Care Note  Patient: Ricky Heath  Procedure(s) Performed: Procedure(s) with comments: LUMBAR FIVE- SACRAL ONE LAMINECTOMY/DECOMPRESSION MICRODISCECTOMY  (N/A) - Right redo L5S1 microdiskectomy  Patient Location: PACU  Anesthesia Type:General  Level of Consciousness: awake, alert  and oriented  Airway & Oxygen Therapy: Patient Spontanous Breathing and Patient connected to nasal cannula oxygen  Post-op Assessment: Report given to PACU RN, Post -op Vital signs reviewed and stable and Patient moving all extremities X 4  Post vital signs: Reviewed and stable  Complications: No apparent anesthesia complications

## 2014-06-30 NOTE — Progress Notes (Signed)
Spoke with Dr.Manny about B/P;ok to proceed and Dr.Edwards will evaluate once he gets to neuro

## 2014-06-30 NOTE — Anesthesia Preprocedure Evaluation (Addendum)
Anesthesia Evaluation  Patient identified by MRN, date of birth, ID band Patient awake    Reviewed: Allergy & Precautions, H&P , NPO status , Patient's Chart, lab work & pertinent test results, reviewed documented beta blocker date and time   History of Anesthesia Complications (+) PONV and history of anesthetic complications  Airway Mallampati: II      Dental   Pulmonary neg pulmonary ROS,  breath sounds clear to auscultation        Cardiovascular hypertension, Rhythm:regular     Neuro/Psych    GI/Hepatic negative GI ROS, Neg liver ROS,   Endo/Other  negative endocrine ROS  Renal/GU Renal disease     Musculoskeletal   Abdominal   Peds  Hematology   Anesthesia Other Findings   Reproductive/Obstetrics                          Anesthesia Physical Anesthesia Plan  ASA: III  Anesthesia Plan: General ETT and General   Post-op Pain Management:    Induction: Intravenous  Airway Management Planned: Oral ETT  Additional Equipment:   Intra-op Plan:   Post-operative Plan: Extubation in OR  Informed Consent: I have reviewed the patients History and Physical, chart, labs and discussed the procedure including the risks, benefits and alternatives for the proposed anesthesia with the patient or authorized representative who has indicated his/her understanding and acceptance.   Dental advisory given  Plan Discussed with: CRNA and Anesthesiologist  Anesthesia Plan Comments:        Anesthesia Quick Evaluation

## 2014-06-30 NOTE — Anesthesia Postprocedure Evaluation (Signed)
  Anesthesia Post-op Note  Patient: Ricky Heath  Procedure(s) Performed: Procedure(s) with comments: LUMBAR FIVE- SACRAL ONE LAMINECTOMY/DECOMPRESSION MICRODISCECTOMY  (N/A) - Right redo L5S1 microdiskectomy  Patient Location: PACU  Anesthesia Type:General  Level of Consciousness: awake  Airway and Oxygen Therapy: Patient Spontanous Breathing  Post-op Pain: mild  Post-op Assessment: Post-op Vital signs reviewed  Post-op Vital Signs: Reviewed  Last Vitals:  Filed Vitals:   06/30/14 0930  BP: 180/106  Pulse:   Temp:   Resp:     Complications: No apparent anesthesia complications

## 2014-06-30 NOTE — Op Note (Signed)
06/30/2014  9:05 AM  PATIENT:  Ricky Heath  58 y.o. male  PRE-OPERATIVE DIAGNOSIS:  lumbar herniated disc recurrent, Right L5/s1  POST-OPERATIVE DIAGNOSIS:  lumbar herniated disc, recurrent right L5/S1  PROCEDURE:  Procedure(s):redo right L5/S1 LUMBAR LAMINECTOMY/DECOMPRESSION MICRODISCECTOMY 1 LEVEL  SURGEON:  Surgeon(s): Coletta Memos, MD  ASSISTANTS:none  ANESTHESIA:   general  EBL:  Total I/O In: 1000 [I.V.:1000] Out: -   BLOOD ADMINISTERED:none  CELL SAVER GIVEN:none  COUNT:per nursing  DRAINS: none   SPECIMEN:  No Specimen  DICTATION: Mr. Goodrich was taken to the operating room, intubated and placed under a general anesthetic without difficulty. He was positioned prone on a Wilson frame with all pressure points padded. His back was prepped and draped in a sterile manner. I opened the skin with a 10 blade and carried the dissection down to the thoracolumbar fascia. I used both sharp dissection and the monopolar cautery to expose the lamina of L5, and S1. I confirmed my location with an intraoperative xray.  I used the drill, Kerrison punches, and curettes to perform a semihemilaminectomy of L5. I used the punches to remove the ligamentum flavum to expose the thecal sac. I brought the microscope into the operative field and  started the decompression of the spinal canal, thecal sac and S1 root(s). The thecal sac was exposed without great difficulty working through the scar tissue with hooks and blunt dissectors along with curettes and the Kerrison . I opened the disc space with a double ended ganglion knife, and nerve hook and proceeded with the discectomy. The thecal sac was humped on top of a large mass, which was indeed the disc.  I used pituitary rongeurs, curettes, and other instruments to remove disc material. After the discectomy was completed I inspected the S1 nerve root and felt it was well decompressed. I explored rostrally, laterally, medially, and caudally and was  satisfied with the decompression. I irrigated the wound, then closed in layers. I approximated the thoracolumbar fascia, subcutaneous, and subcuticular planes with vicryl sutures. I used dermabond for a sterile dressing.   PLAN OF CARE: Admit for overnight observation  PATIENT DISPOSITION:  PACU - hemodynamically stable.   Delay start of Pharmacological VTE agent (>24hrs) due to surgical blood loss or risk of bleeding:  yes

## 2014-06-30 NOTE — H&P (Signed)
BP 204/110  Pulse 107  Temp(Src) 97.9 F (36.6 C)  Ht  (1.702 m)  Wt 94.802 kg (209 lb)  BMI 32.73 kg/m2  SpO2 94% HISTORY OF PRESENT ILLNESS: Mr. Dettman is a patient of mine, whom I had taken to the operating room in November of 2008 for a herniated disc eccentric to the right side at L5-S1 causing right S1 radiculopathy. He did extraordinarily well after that operation and was in no distress until approximately three weeks ago. At that time, he started to have pain again in his back and right lower extremity. He said he did contact the office, but since he had not been seen in such a long time, he was made to schedule an appointment. The last time he actually had been seen was in 2009. Nevertheless, he comes in today for evaluation of the pain he has in his back and right lower extremity. He says he still has numbness as a result of the operation in 2008, going on the outside of his foot. 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 He has pain only on the right side, nothing on the left. Feels some weakness in his right lower extremity. Does feel numbness and tingling in the right lower extremity. Bowel and bladder function is normal. REVIEW OF SYSTEMS: Positive for back and right lower extremity pain, hypertension, leg pain with walking and at rest, and leg weakness. Denies constitutional, eye, ear, nose, throat, mouth, respiratory, gastrointestinal, skin, neurological, psychiatric, endocrine, hematologic, and allergic problems. On his pain chart, he lists pain in his back and the entire right lower extremity traveling through the buttocks, posterior thigh, and posterior calf. He says he is not really sure of what happened, he lifts things all the time, but he cannot really remember lifting any one particular item. PAST MEDICAL HISTORY:  Current Medical Conditions: Significant for hypertension.  Prior Operations: He has undergone back surgery and  has had kidney stone treatment.  Medications and Allergies: No known drug allergies. Medications are Norco 5/325. FAMILY HISTORY: He is adopted and he does not have his family history. SOCIAL HISTORY: He does not smoke. He does not use alcohol. He does not use illicit drugs. PHYSICAL EXAMINATION: Vital signs today are as follows: Height is 67 inches, weight 205 pounds, BMI is 32.11, blood pressure is 165/107, pulse is 90, and respiratory rate is 14. Mr. Luce is alert and oriented on exam. Pupils equal, round, and reactive to light. Full extraocular movements. Full visual fields. Hearing intact to voice bilaterally. Uvula elevates in the midline. Shoulder shrug is normal. Tongue protrudes in the midline. Symmetric facial sensation. 5/5 strength in the upper extremities. Normal strength in the lower extremities on manual exam. No reflex elicited at the right Achilles, 2+ at the left, 2+ at both knees, and 2+ in the upper extremities. Proprioception is absent in both upper and lower extremities. Muscle tone, bulk, and coordination is normal. Gait mildly antalgic favoring the right lower extremity. IMPRESSION/PLAN Mr. Maffeo has had physical therapy, which has been unsuccessful. He has had anti-inflammatories, which have also been unsuccessful in relieving his pain. At this time, I believe an MRI with and without contrast of the lumbar spine has been ordered to ascertain the reason for his radicular symptoms. Mr. Shamal Stracener returns with the MRI of the lumbar spine.  DATA: What it shows is a very large recurrence at L5-S1 eccentric to the right  side. This is the reason for the pain that he has had recently. Physical exam, past medical history, family history, social history, medications, and allergies are all unchanged from 06/14/2014. PHYSICAL EXAMINATION: Vital signs: Height is unchanged, 67 inches. Weight is 209 pounds. BMI is 32.73. Blood pressure is 176/124. Pulse is 101. Respiratory rate is 18.  Temperature is 99 F. Pain is 8/10.  IMPRESSION/PLAN: I expressed to him that I believed his best option is surgery. He understands and wishes to proceed. We will schedule this for this coming Thursday. I will give Mr. Boyadjian, today, a prescription for Percocet which he said he does better with and promethazine for nausea. I plan on doing this operation and him going home Thursday night. We did discuss the risks and a much higher risk now of a possible spinal fluid leak. We would just keep him in the hospital longer. The risks and benefits are the same as they were in 2008. No improvement, recurrent disc herniation, need for further surgery, damage to the nerve root causing weakness in his foot and/or calf, possible foot drop if there was damage to the L5 root, and other extended risks. I will give him the same sheet I gave him in 2007 about the risks and benefits. He understands and wishes to proceed.

## 2014-06-30 NOTE — Anesthesia Procedure Notes (Signed)
Procedure Name: Intubation Date/Time: 06/30/2014 7:51 AM Performed by: Carmela Rima Pre-anesthesia Checklist: Patient identified, Timeout performed, Emergency Drugs available, Suction available and Patient being monitored Patient Re-evaluated:Patient Re-evaluated prior to inductionOxygen Delivery Method: Circle system utilized Preoxygenation: Pre-oxygenation with 100% oxygen Intubation Type: IV induction Ventilation: Mask ventilation without difficulty Laryngoscope Size: Mac and 3 Grade View: Grade III Tube type: Oral Tube size: 7.5 mm Number of attempts: 2 Airway Equipment and Method: Video-laryngoscopy Placement Confirmation: ETT inserted through vocal cords under direct vision,  positive ETCO2 and breath sounds checked- equal and bilateral Secured at: 23 cm Tube secured with: Tape Dental Injury: Injury to lip

## 2014-07-01 ENCOUNTER — Encounter (HOSPITAL_COMMUNITY): Payer: Self-pay | Admitting: Neurosurgery

## 2014-07-01 DIAGNOSIS — M5126 Other intervertebral disc displacement, lumbar region: Secondary | ICD-10-CM | POA: Diagnosis not present

## 2014-07-01 MED ORDER — HYDROCODONE-ACETAMINOPHEN 5-325 MG PO TABS: 1.0000 | ORAL_TABLET | Freq: Four times a day (QID) | ORAL | Status: DC | PRN

## 2014-07-01 MED ORDER — CYCLOBENZAPRINE HCL 10 MG PO TABS: 10.0000 mg | ORAL_TABLET | Freq: Three times a day (TID) | ORAL | Status: DC | PRN

## 2014-07-01 NOTE — Progress Notes (Signed)
Pt given D/C instructions with Rx's, verbal understanding was provided. Pt's incision was closed with glue and had no sign of infection. Pt's IV was removed prior to D/C. Pt D/C'd home via wheelchair @ 1410 per MD order. Pt is stable @ D/C and has no other needs. Rema Fendt, RN

## 2014-07-01 NOTE — Discharge Instructions (Signed)
Lumbar Discectomy °Care After °A discectomy involves removal of discmaterial (the cartilage-like structures located between the bones of the back). It is done to relieve pressure on nerve roots. It can be used as a treatment for a back problem. The time in surgery depends on the findings in surgery and what is necessary to correct the problems. °HOME CARE INSTRUCTIONS  °· Check the cut (incision) made by the surgeon twice a day for signs of infection. Some signs of infection may include:  °· A foul smelling, greenish or yellowish discharge from the wound.  °· Increased pain.  °· Increased redness over the incision (operative) site.  °· The skin edges may separate.  °· Flu-like symptoms (problems).  °· A temperature above 101.5° F (38.6° C).  °· Change your bandages in about 24 to 36 hours following surgery or as directed.  °· You may shower tomrrow.  Avoid bathtubs, swimming pools and hot tubs for three weeks or until your incision has healed completely. °· Follow your doctor's instructions as to safe activities, exercises, and physical therapy.  °· Weight reduction may be beneficial if you are overweight.  °· Daily exercise is helpful to prevent the return of problems. Walking is permitted. You may use a treadmill without an incline. Cut down on activities and exercise if you have discomfort. You may also go up and down stairs as much as you can tolerate.  °· DO NOT lift anything heavier than 10 to 15 lbs. Avoid bending or twisting at the waist. Always bend your knees when lifting.  °· Maintain strength and range of motion as instructed.  °· Do not drive for 10 days, or as directed by your doctors. You may be a passenger . Lying back in the passenger seat may be more comfortable for you. Always wear a seatbelt.  °· Limit your sitting in a regular chair to 20 to 30 minutes at a time. There are no limitations for sitting in a recliner. You should lie down or walk in between sitting periods.  °· Only take  over-the-counter or prescription medicines for pain, discomfort, or fever as directed by your caregiver.  °SEEK MEDICAL CARE IF:  °· There is increased bleeding (more than a small spot) from the wound.  °· You notice redness, swelling, or increasing pain in the wound.  °· Pus is coming from wound.  °· You develop an unexplained oral temperature above 102° F (38.9° C) develops.  °· You notice a foul smell coming from the wound or dressing.  °· You have increasing pain in your wound.  °SEEK IMMEDIATE MEDICAL CARE IF:  °· You develop a rash.  °· You have difficulty breathing.  °· You develop any allergic problems to medicines given.  °Document Released: 09/18/2004 Document Revised: 10/03/2011 Document Reviewed: 01/07/2008 °ExitCare® Patient Information °

## 2014-07-01 NOTE — Discharge Summary (Signed)
  Physician Discharge Summary  Patient ID: Ricky Heath MRN: [id number] DOB/AGE: Nov 23, 1955 58 y.o.  Admit date: 06/30/2014 Discharge date: 07/01/2014  Admission Diagnoses:Recurrent HNP L5/S1, right  Discharge Diagnoses: lumbar herniated disc recurrent, Right L5/s1  Active Problems:   HNP (herniated nucleus pulposus), lumbar   Discharged Condition: good  Hospital Course: [name] was admitted and taken to the operating room for an uncomplicated redo discetomy of L5/S1 on the right side.He is voiding, ambulating, and tolerating a regular diet. His wound is clean, dry, and without signs of infection.   Treatments: surgery: redo right L5/S1 LUMBAR LAMINECTOMY/DECOMPRESSION MICRODISCECTOMY 1 LEVEL   Discharge Exam: Blood pressure 158/100, pulse 109, temperature 98.2 F (36.8 C), temperature source Oral, resp. rate 18, height  (1.702 m), weight 94.802 kg (209 lb), SpO2 93.00%. General appearance: alert, cooperative, appears stated age and no distress Neurologic: Alert and oriented X 3, normal strength and tone. Normal symmetric reflexes. Normal coordination and gait  Disposition:  lumbar herniated disc    Medication List         amLODipine 5 MG tablet  Commonly known as:  NORVASC  Take 1 tablet (5 mg total) by mouth daily.     cyclobenzaprine 10 MG tablet  Commonly known as:  FLEXERIL  Take 1 tablet (10 mg total) by mouth 3 (three) times daily as needed for muscle spasms.     docusate sodium 100 MG capsule  Commonly known as:  COLACE  Take 100 mg by mouth 2 (two) times daily.     HYDROcodone-acetaminophen 5-325 MG per tablet  Commonly known as:  NORCO/VICODIN  Take 1 tablet by mouth every 6 (six) hours as needed for moderate pain.     ibuprofen 200 MG tablet  Commonly known as:  ADVIL,MOTRIN  Take 200 mg by mouth every 6 (six) hours as needed (takes 2 every 6-8 hours as needed).     omeprazole 40 MG capsule  Commonly known as:  PRILOSEC  Take 1 capsule (40 mg  total) by mouth daily.     oxyCODONE-acetaminophen 5-325 MG per tablet  Commonly known as:  PERCOCET/ROXICET  Take 1 tablet by mouth every 4 (four) hours as needed for severe pain.     polyethylene glycol packet  Commonly known as:  MIRALAX / GLYCOLAX  Take 17 g by mouth daily.     promethazine 12.5 MG tablet  Commonly known as:  PHENERGAN  Take 12.5 mg by mouth every 6 (six) hours as needed for nausea or vomiting (one 4 times a day as needed).           Follow-up Information   Follow up with [name], MD In 3 weeks. (call office to make an appointment)    Specialty:  Neurosurgery   Contact information:   [address] [place] [address] [phone]       Signed: Ramesh Moan L 07/01/2014, 1:15 PM

## 2014-07-01 NOTE — Progress Notes (Signed)
UR Completed.  Jacinda Kanady Jane [phone] 07/01/2014  

## 2014-07-12 ENCOUNTER — Ambulatory Visit: Payer: Managed Care, Other (non HMO) | Admitting: Cardiovascular Disease

## 2014-07-13 ENCOUNTER — Ambulatory Visit (INDEPENDENT_AMBULATORY_CARE_PROVIDER_SITE_OTHER): Payer: Managed Care, Other (non HMO) | Admitting: Cardiovascular Disease

## 2014-07-13 ENCOUNTER — Ambulatory Visit: Payer: Managed Care, Other (non HMO) | Admitting: Cardiovascular Disease

## 2014-07-13 ENCOUNTER — Encounter: Payer: Self-pay | Admitting: Cardiovascular Disease

## 2014-07-13 VITALS — BP 152/110 | HR 106 | Ht 67.0 in | Wt 205.0 lb

## 2014-07-13 DIAGNOSIS — E669 Obesity, unspecified: Secondary | ICD-10-CM

## 2014-07-13 DIAGNOSIS — I498 Other specified cardiac arrhythmias: Secondary | ICD-10-CM

## 2014-07-13 DIAGNOSIS — R000 Tachycardia, unspecified: Secondary | ICD-10-CM | POA: Insufficient documentation

## 2014-07-13 DIAGNOSIS — Z0000 Encounter for general adult medical examination without abnormal findings: Secondary | ICD-10-CM

## 2014-07-13 DIAGNOSIS — R9431 Abnormal electrocardiogram [ECG] [EKG]: Secondary | ICD-10-CM

## 2014-07-13 DIAGNOSIS — R059 Cough, unspecified: Secondary | ICD-10-CM

## 2014-07-13 DIAGNOSIS — M5126 Other intervertebral disc displacement, lumbar region: Secondary | ICD-10-CM

## 2014-07-13 DIAGNOSIS — E785 Hyperlipidemia, unspecified: Secondary | ICD-10-CM

## 2014-07-13 DIAGNOSIS — R05 Cough: Secondary | ICD-10-CM

## 2014-07-13 DIAGNOSIS — I10 Essential (primary) hypertension: Secondary | ICD-10-CM

## 2014-07-13 MED ORDER — LOSARTAN POTASSIUM-HCTZ 100-25 MG PO TABS: 1.0000 | ORAL_TABLET | Freq: Every day | ORAL | Status: DC

## 2014-07-13 NOTE — Assessment & Plan Note (Signed)
We will start losartan HCTZ 100/25 mg We have recommended he start one half pill per day for the first week with close monitoring of his heart rate and blood pressure If blood pressure continues to run high, we would increase to a full pill. If heart rate continues to run high as shown today on EKG and no clinical exam, we could add a low-dose beta blocker

## 2014-07-13 NOTE — Assessment & Plan Note (Signed)
We have encouraged continued exercise, careful diet management in an effort to lose weight. 

## 2014-07-13 NOTE — Patient Instructions (Signed)
You are doing well. Blood pressure is high  Please start 1/2 pill of the losartan HCTZ for one week, Monitor your blood pressure Goal is <140/90 If it runs high, Take a full pill  Come in for lab work, fasting anytime  Please call us if you have new issues that need to be addressed before your next appt.  Your physician wants you to follow-up in: 6 months.  You will receive a reminder letter in the mail two months in advance. If you don't receive a letter, please call our office to schedule the follow-up appointment.

## 2014-07-13 NOTE — Assessment & Plan Note (Signed)
Seems to have recovered well from his back surgery. Denies having significant pain. He is out of work until November

## 2014-07-13 NOTE — Assessment & Plan Note (Signed)
Recommended he closely monitor his heart rate at home and call our office if it continues to run high. Etiology unclear though he is recovering from back surgery. He denies being in significant pain

## 2014-07-13 NOTE — Assessment & Plan Note (Signed)
We have ordered a fasting lab for cholesterol check next week

## 2014-07-13 NOTE — Progress Notes (Addendum)
   Patient ID: Ricky Heath, male    DOB: [birth year], 58 y.o.   MRN: [id number]  HPI Comments: Mr. Keisler  is a pleasant 58 year old gentleman with obesity, hypertension, recent back surgery who presents for management of his blood pressure.  Previously he used to take lisinopril HCTZ.  Previous notes indicate he was taking amlodipine. Currently he is not taking anything. During his back surgery it was noted that he had systolic pressures in the low 409W. He works first shift at [employer]. Otherwise feels well with no complaints. Weight is an issue and continues to trend upwards. He does not do any regular exercise.  He denies any diabetes, no smoking history His wife works in a pharmacy and is concerned about potential cough with lisinopril and is requesting losartan HCTZ.  Previous cholesterol in 2011 was 155  He does report losing weight in the past up to 27 pounds. He has gained this back again  EKG shows sinus tachycardia with rate 106 beats per minute, no significant ST or T wave changes     Outpatient Encounter Prescriptions as of 07/13/2014  Medication Sig  . omeprazole (PRILOSEC) 40 MG capsule Take 1 capsule (40 mg total) by mouth daily.    Review of Systems  Constitutional: Negative.   HENT: Negative.   Eyes: Negative.   Respiratory: Negative.   Cardiovascular: Negative.   Gastrointestinal: Negative.   Endocrine: Negative.   Musculoskeletal: Negative.   Skin: Negative.   Allergic/Immunologic: Negative.   Neurological: Negative.   Hematological: Negative.   Psychiatric/Behavioral: Negative.   All other systems reviewed and are negative.   BP 152/110  Pulse 106  Ht  (1.702 m)  Wt 205 lb (92.987 kg)  BMI 32.10 kg/m2  Physical Exam  Nursing note and vitals reviewed. Constitutional: He is oriented to person, place, and time. He appears well-developed and well-nourished.  Obese  HENT:  Head: Normocephalic.  Nose: Nose normal.  Mouth/Throat: Oropharynx  is clear and moist.  Eyes: Conjunctivae are normal. Pupils are equal, round, and reactive to light.  Neck: Normal range of motion. Neck supple. No JVD present.  Cardiovascular: Normal rate, regular rhythm, S1 normal, S2 normal, normal heart sounds and intact distal pulses.  Exam reveals no gallop and no friction rub.   No murmur heard. Pulmonary/Chest: Effort normal and breath sounds normal. No respiratory distress. He has no wheezes. He has no rales. He exhibits no tenderness.  Abdominal: Soft. Bowel sounds are normal. He exhibits no distension. There is no tenderness.  Musculoskeletal: Normal range of motion. He exhibits no edema and no tenderness.  Lymphadenopathy:    He has no cervical adenopathy.  Neurological: He is alert and oriented to person, place, and time. Coordination normal.  Skin: Skin is warm and dry. No rash noted. No erythema.  Psychiatric: He has a normal mood and affect. His behavior is normal. Judgment and thought content normal.      Assessment and Plan

## 2014-07-13 NOTE — Assessment & Plan Note (Signed)
Wife was previously worried about ACE inhibitor cough. We will avoid ACE inhibitors and start losartan

## 2014-07-14 ENCOUNTER — Telehealth: Payer: Self-pay | Admitting: *Deleted

## 2014-07-14 ENCOUNTER — Encounter (HOSPITAL_COMMUNITY): Payer: Self-pay | Admitting: Neurosurgery

## 2014-07-14 ENCOUNTER — Ambulatory Visit: Payer: Managed Care, Other (non HMO) | Admitting: Cardiovascular Disease

## 2014-07-14 NOTE — Addendum Note (Signed)
Addendum created 07/14/14 1236 by Judie Petit, MD   Modules edited: Anesthesia Events

## 2014-07-14 NOTE — Telephone Encounter (Signed)
Patient's wife called and the patient  is having a reaction to Losartan 1/2 tab he is having headache, hot flashes, nausea, heart speeds up. Thinks the bp medicine is too much. Please call.

## 2014-07-14 NOTE — Telephone Encounter (Signed)
Spoke w/ pt's wife.  She states that she is concerned that pt's losartan dose is too high.  Pt is taking a losartan HCTZ 100/25mg , currently taking 1/2 pill. Wife states that pt feels hot, nauseous, has a headache and feels that his heart is speeding up.  Pt has not been monitoring his HR or BP as advised.  BP today is 132/82, but this was done manually and wife did not check HR. Pt currently en route to ENT to have ears cleaned out, wife will call and make sure he gets record of vitals at this visit and calls her back w/ numbers.

## 2014-07-15 ENCOUNTER — Telehealth: Payer: Self-pay

## 2014-07-15 NOTE — Telephone Encounter (Signed)
Pt wife called, states pt pulse rate is 98 yesterday. States she is concerned about this. Please call. States he had a dull headache all day yesterday, thinks it may be from the hydrochlorothiazide.

## 2014-07-15 NOTE — Telephone Encounter (Signed)
Please see previous phone note.  

## 2014-07-15 NOTE — Telephone Encounter (Signed)
    Sandre Kitty at 07/15/2014 8:54 AM     Status: Signed        Pt wife called, states pt pulse rate is 98 yesterday. States she is concerned about this. Please call. States he had a dull headache all day yesterday, thinks it may be from the hydrochlorothiazide.

## 2014-07-18 NOTE — Telephone Encounter (Signed)
Left message for pt to call back  °

## 2014-07-18 NOTE — Telephone Encounter (Signed)
Notes indicate he was previously on amlodipine. Uncertain if he had side effects He could start this at 10 mg daily Would monitor her heart rate If this continues to run high, may need to add an additional medication such as a beta blocker

## 2014-07-18 NOTE — Telephone Encounter (Signed)
Spoke w/ pt's wife.  She reports that pt is no longer taking losartan due to the side effects.  She reports that pt "feels fine", that his BP and HR are "about the same", though she does not have numbers to report.  She states that all of his side effects have resolved and that "he just can't take that medicine".  She would like a call back today regarding which med pt should be switched to.  Please advise.  Thank you.

## 2014-07-19 NOTE — Telephone Encounter (Signed)
Pt's wife called again stating that she is certain that  amlodipine will be too much, to make sure that we send rx to [facility], and that they not be home between 3:00-3:15.

## 2014-07-19 NOTE — Telephone Encounter (Signed)
Spoke w/ pt's wife.  She reports that she does not recall pt having any side effects on amlodipine and she is agreeable to pt starting this.  She states, however, that she works in pharmacy and would prefer to start pt on , as she feels that pt is sensitive to meds and she is concerned about the dose.  Advised her that I can send in  and have her cut this in 1/2 to see if pt can tolerate, but she insists on 5 mg. She states that it is difficult to monitor pt's BP and HR, as "it's the last thing he wants to do and I can't steer him that way".

## 2014-07-19 NOTE — Telephone Encounter (Signed)
Okay to start on amlodipine 5 mg daily

## 2014-07-20 MED ORDER — AMLODIPINE BESYLATE 5 MG PO TABS: 5.0000 mg | ORAL_TABLET | Freq: Every day | ORAL | Status: AC

## 2014-07-20 NOTE — Telephone Encounter (Signed)
Spoke w/ pt.  Advised him of Dr. Windell Hummingbird recommendation and that I am sending in 30 day supply.  He is appreciative and will call back with further questions or concerns.

## 2014-07-21 ENCOUNTER — Ambulatory Visit (INDEPENDENT_AMBULATORY_CARE_PROVIDER_SITE_OTHER): Payer: Managed Care, Other (non HMO) | Admitting: *Deleted

## 2014-07-21 DIAGNOSIS — E785 Hyperlipidemia, unspecified: Secondary | ICD-10-CM

## 2014-07-21 DIAGNOSIS — I10 Essential (primary) hypertension: Secondary | ICD-10-CM

## 2014-07-21 DIAGNOSIS — R9431 Abnormal electrocardiogram [ECG] [EKG]: Secondary | ICD-10-CM

## 2014-07-22 ENCOUNTER — Ambulatory Visit: Payer: Managed Care, Other (non HMO) | Admitting: Cardiovascular Disease

## 2014-07-22 LAB — LIPID PANEL
Chol/HDL Ratio: 5.3 ratio units — ABNORMAL HIGH (ref 0.0–5.0)
Cholesterol, Total: 190 mg/dL (ref 100–199)
HDL: 36 mg/dL — ABNORMAL LOW (ref >39)
LDL Calculated: 112 mg/dL — ABNORMAL HIGH (ref 0–99)
Triglycerides: 211 mg/dL — ABNORMAL HIGH (ref 0–149)
VLDL Cholesterol Cal: 42 mg/dL — ABNORMAL HIGH (ref 5–40)

## 2014-07-25 ENCOUNTER — Ambulatory Visit: Payer: Managed Care, Other (non HMO) | Admitting: Cardiovascular Disease

## 2014-07-27 ENCOUNTER — Ambulatory Visit: Payer: Managed Care, Other (non HMO) | Admitting: Cardiovascular Disease

## 2014-08-08 ENCOUNTER — Ambulatory Visit: Payer: Self-pay | Admitting: Physician Assistant

## 2014-12-05 ENCOUNTER — Ambulatory Visit: Payer: Self-pay

## 2014-12-05 LAB — RAPID INFLUENZA A&B ANTIGENS

## 2015-02-17 NOTE — Op Note (Signed)
PATIENT NAME:  Ricky Heath, Ricky Heath MR#:  [id number] DATE OF BIRTH:  01-25-1956  DATE OF PROCEDURE:  06/01/2013  PRINCIPAL DIAGNOSIS: Right ureterolithiasis.  POSTOPERATIVE DIAGNOSIS: Right ureterolithiasis, ureteral stricture.   PROCEDURE: Right ureteroscopy with balloon dilation, Holmium laser lithotripsy, right double-J ureteral stent placement.   SURGEON: Assunta Gambles, MD   ANESTHESIA: General endotracheal anesthesia.   INDICATIONS: The patient is a 59 year old gentleman with a history of nephrolithiasis. He presented several weeks prior to the Emergency Room with sudden onset right-sided flank pain. A CT scan was obtained demonstrating a 5 mm stone at the level of the crossing vessels on the right. There has been failure of progression with continued intermittent pain and discomfort. He presents for ureteroscopic stone removal.   DESCRIPTION OF PROCEDURE: After informed consent was obtained, the patient was taken to the operating room and placed in the dorsal lithotomy position under general endotracheal anesthesia. The patient was then prepped and draped in the usual standard fashion. The 22-French rigid cystoscope was introduced into the urethra under direct vision with no urethral abnormalities noted. Upon entering the prostatic urethra, moderate bilobar hypertrophy was noted with only partial visual obstruction. Upon entering the bladder, the mucosa was inspected in its entirety with no gross mucosal lesions noted. Bilateral ureteral orifices were well visualized with no lesions noted. A flexible tip Glidewire was introduced into the right ureteral orifice. It was easily advanced into the upper pole collecting system under fluoroscopic guidance without difficulty. The ureteral orifice itself was noted to be relatively small and pinpoint in nature. The cystoscope was removed. The 6-French rigid ureteroscope was advanced into the urinary bladder. A second guidewire was advanced through the scope to help  aid in navigation into the ureteral orifice. With the second guidewire in place, the scope was able to be manipulated approximately 1 cm into the distal ureter. An area of narrowing was encountered. Multiple attempts were made at dilation utilizing the scope. This was unsuccessful due to concerns over injury to the ureter. The decision was made to proceed with balloon dilation. The ureteroscope was removed. The second guidewire was left in place. The cystoscope was backloaded over the guidewire. A 4 cm 15-French balloon was inserted into the distal urethra. It was inflated in the standard fashion. Visualization under fluoroscopy demonstrated an approximate 3 mm tight band in the mid balloon. This was successfully dilated; however, after several minutes of continued pressure the balloon was deflated. It was then removed. The ureteroscope was replaced. The guidewire was utilized to help facilitate passage of the scope through the area of balloon dilation.  Slight bleeding was encountered through this region due to the dilation. In the area of the ureter above the dilation, moderate erythema was encountered at the level of the crossing vessels. The 5 mm stone was visualized. Extensive inflammation was present at this level. A small amount of debris was all that was noted. The holmium laser fiber was then utilized to fragment the stone into multiple smaller pieces. The stone was noted to be relatively soft in nature. A basket was then utilized to remove the majority of the stone fragments. The scope was advanced to the level of the ureteropelvic junction with no additional stones or abnormalities noted. The ureter proximal to the stone impaction was noted to be moderately dilated. No other significant abnormalities were appreciated. Due to the extent of the stricture and need for dilation, the decision was made to proceed with stent placement. The ureteroscope was removed. The cystoscope  was replaced back over the  guidewire. A 6-French x 24 cm double-J ureteral stent was advanced over the guidewire into the upper pole collecting system without difficulty. Adequate curl was noted within the renal pelvis. Adequate curl was also noted within the urinary bladder. The bladder was irrigated free of the stone fragments. These were collected and will be sent for stone analysis. The bladder was then drained. The cystoscope was removed. The patient was returned to the supine position and awakened from general endotracheal anesthesia. He was taken to the recovery room.  There were no problems or complications. The patient tolerated the procedure well.     ____________________________ Madolyn FriezeBrian S. Achilles Dunkope, MD bsc:cb D: 06/01/2013 18:59:36 ET T: 06/01/2013 23:41:59 ET JOB#: [id number]  cc: Madolyn FriezeBrian S. Achilles Dunkope, MD, <Dictator> Madolyn FriezeBRIAN S Akim Watkinson MD ELECTRONICALLY SIGNED 06/03/2013 8:06

## 2016-04-12 DIAGNOSIS — E119 Type 2 diabetes mellitus without complications: Secondary | ICD-10-CM | POA: Insufficient documentation

## 2016-07-11 ENCOUNTER — Encounter: Payer: Self-pay | Admitting: *Deleted

## 2016-07-12 ENCOUNTER — Encounter
Admission: RE | Disposition: A | Discharge status: Home / Self Care | Payer: Self-pay | Source: Ambulatory Visit | Attending: Gastroenterology

## 2016-07-12 ENCOUNTER — Ambulatory Visit
Admission: RE | Admit: 2016-07-12 | Discharge: 2016-07-12 | Disposition: A | Discharge status: Home / Self Care | Payer: Managed Care, Other (non HMO) | Source: Ambulatory Visit | Attending: Gastroenterology | Admitting: Gastroenterology

## 2016-07-12 ENCOUNTER — Encounter: Payer: Self-pay | Admitting: *Deleted

## 2016-07-12 ENCOUNTER — Ambulatory Visit: Payer: Managed Care, Other (non HMO) | Admitting: Anesthesiology

## 2016-07-12 DIAGNOSIS — I10 Essential (primary) hypertension: Secondary | ICD-10-CM | POA: Insufficient documentation

## 2016-07-12 DIAGNOSIS — Z79899 Other long term (current) drug therapy: Secondary | ICD-10-CM | POA: Diagnosis not present

## 2016-07-12 DIAGNOSIS — K621 Rectal polyp: Secondary | ICD-10-CM | POA: Insufficient documentation

## 2016-07-12 DIAGNOSIS — Z1211 Encounter for screening for malignant neoplasm of colon: Secondary | ICD-10-CM | POA: Diagnosis present

## 2016-07-12 DIAGNOSIS — M5020 Other cervical disc displacement, unspecified cervical region: Secondary | ICD-10-CM | POA: Insufficient documentation

## 2016-07-12 DIAGNOSIS — K5730 Diverticulosis of large intestine without perforation or abscess without bleeding: Secondary | ICD-10-CM | POA: Diagnosis not present

## 2016-07-12 DIAGNOSIS — Z87442 Personal history of urinary calculi: Secondary | ICD-10-CM | POA: Diagnosis not present

## 2016-07-12 HISTORY — PX: COLONOSCOPY WITH PROPOFOL: SHX5780

## 2016-07-12 LAB — HM COLONOSCOPY

## 2016-07-12 SURGERY — COLONOSCOPY WITH PROPOFOL
Anesthesia: General

## 2016-07-12 MED ORDER — ONDANSETRON HCL 4 MG/2ML IJ SOLN
INTRAMUSCULAR | Status: DC | PRN
  Administered 2016-07-12: 4 mg via INTRAVENOUS

## 2016-07-12 MED ORDER — SODIUM CHLORIDE 0.9 % IV SOLN
INTRAVENOUS | Status: DC
  Administered 2016-07-12 (×2): via INTRAVENOUS
  Administered 2016-07-12: 1000 mL via INTRAVENOUS

## 2016-07-12 MED ORDER — DEXAMETHASONE SODIUM PHOSPHATE 10 MG/ML IJ SOLN
INTRAMUSCULAR | Status: DC | PRN
  Administered 2016-07-12: 5 mg via INTRAVENOUS

## 2016-07-12 MED ORDER — PROPOFOL 10 MG/ML IV BOLUS
INTRAVENOUS | Status: DC | PRN
  Administered 2016-07-12: 50 mg via INTRAVENOUS

## 2016-07-12 MED ORDER — SODIUM CHLORIDE 0.9 % IV SOLN: INTRAVENOUS | Status: DC

## 2016-07-12 MED ORDER — PROPOFOL 500 MG/50ML IV EMUL
INTRAVENOUS | Status: DC | PRN
  Administered 2016-07-12: 150 ug/kg/min via INTRAVENOUS

## 2016-07-12 MED ORDER — MIDAZOLAM HCL 2 MG/2ML IJ SOLN
INTRAMUSCULAR | Status: DC | PRN
  Administered 2016-07-12: 1 mg via INTRAVENOUS

## 2016-07-12 MED ORDER — FENTANYL CITRATE (PF) 100 MCG/2ML IJ SOLN
INTRAMUSCULAR | Status: DC | PRN
  Administered 2016-07-12: 50 ug via INTRAVENOUS

## 2016-07-12 NOTE — Op Note (Signed)
[facility] Gastroenterology Patient Name: Ricky DoneRobert Heath Procedure Date: 07/12/2016 1:22 PM MRN: [id number] Account #: [id number] Date of Birth: October 01, 1956 Admit Type: Outpatient Age: [age] Room[facility] ROOM 4 Gender: Male Note Status: Finalized Procedure:            Colonoscopy Indications:          Screening for colorectal malignant neoplasm, This is                        the patient's first colonoscopy Providers:            Christena DeemMartin U. Delma Villalva, MD Referring MD:         Duncan Dulleresa Tullo, MD (Referring MD) Medicines:            Monitored Anesthesia Care Complications:        No immediate complications. Procedure:            Pre-Anesthesia Assessment:                       - ASA Grade Assessment: II - A patient with mild                        systemic disease.                       After obtaining informed consent, the colonoscope was                        passed under direct vision. Throughout the procedure,                        the patient's blood pressure, pulse, and oxygen                        saturations were monitored continuously. The                        Colonoscope was introduced through the anus and                        advanced to the the cecum, identified by appendiceal                        orifice and ileocecal valve. The colonoscopy was                        performed without difficulty. The patient tolerated the                        procedure well. The quality of the bowel preparation                        was good. Findings:      A 2 mm polyp was found in the rectum. The polyp was sessile. The polyp       was removed with a cold biopsy forceps. Resection and retrieval were       complete.      A few small-mouthed diverticula were found in the sigmoid colon and       distal descending colon.      The retroflexed view of the distal rectum and  anal verge was normal and       showed no anal or rectal abnormalities.      The exam was  otherwise without abnormality.      The digital rectal exam was normal. Impression:           - One 2 mm polyp in the rectum, removed with a cold                        biopsy forceps. Resected and retrieved.                       - Diverticulosis in the sigmoid colon and in the distal                        descending colon.                       - The distal rectum and anal verge are normal on                        retroflexion view.                       - The examination was otherwise normal. Recommendation:       - Discharge patient to home.                       - Telephone GI clinic for pathology results in 1 week. Procedure Code(s):    --- Professional ---                       [phone], Colonoscopy, flexible; with biopsy, single or                        multiple Diagnosis Code(s):    --- Professional ---                       Z12.11, Encounter for screening for malignant neoplasm                        of colon                       K62.1, Rectal polyp                       K57.30, Diverticulosis of large intestine without                        perforation or abscess without bleeding CPT copyright 2016 [employer]. All rights reserved. The codes documented in this report are preliminary and upon coder review may  be revised to meet current compliance requirements. Christena Deem, MD 07/12/2016 1:54:03 PM This report has been signed electronically. Number of Addenda: 0 Note Initiated On: 07/12/2016 1:22 PM Scope Withdrawal Time: 0 hours 11 minutes 32 seconds  Total Procedure Duration: 0 hours 16 minutes 32 seconds       [facility]

## 2016-07-12 NOTE — H&P (Signed)
Outpatient short stay form Pre-procedure 07/12/2016 1:13 PM Ricky DeemMartin U Kirti Carl MD  Primary Physician: Tora DuckSarah Gauger,NP  Reason for visit:  Colon cancer screening  History of present illness:  Patient is a 60 year old male presenting today as above. He tolerated his prep well. He takes no aspirin or blood thinning agents.    Current Facility-Administered Medications:  .  0.9 %  sodium chloride infusion, , Intravenous, Continuous, Ricky DeemMartin U Fiza Nation, MD, Last Rate: 20 mL/hr at 07/12/16 1233, 1,000 mL at 07/12/16 1233 .  0.9 %  sodium chloride infusion, , Intravenous, Continuous, Ricky DeemMartin U Traci Plemons, MD  Prescriptions Prior to Admission  Medication Sig Dispense Refill Last Dose  . amLODipine (NORVASC) 5 MG tablet Take 1 tablet (5 mg total) by mouth daily. 30 tablet 6 07/12/2016 at 0600  . Efinaconazole (JUBLIA) 10 % SOLN Apply topically.   Not Taking at Unknown time  . losartan-hydrochlorothiazide (HYZAAR) 100-25 MG per tablet Take 1 tablet by mouth daily. (Patient not taking: Reported on 07/12/2016) 30 tablet 11 Not Taking at Unknown time  . omeprazole (PRILOSEC) 40 MG capsule Take 1 capsule (40 mg total) by mouth daily. 30 capsule 3   . traZODone (DESYREL) 50 MG tablet Take 50 mg by mouth at bedtime.   Not Taking at Unknown time     No Known Allergies   Past Medical History:  Diagnosis Date  . Complication of anesthesia   . Herniated disc, cervical   . HTN (hypertension)   . Kidney stones   . PONV (postoperative nausea and vomiting)     Review of systems:      Physical Exam    Heart and lungs: Regular rate and rhythm without rub or gallop, lungs are bilaterally clear.    HEENT: Normocephalic atraumatic eyes are anicteric    Other:     Pertinant exam for procedure: Soft nontender nondistended bowel sounds positive normoactive, protuberant.    Planned proceedures: Colonoscopy and indicated procedures. I have discussed the risks benefits and complications of procedures to  include not limited to bleeding, infection, perforation and the risk of sedation and the patient wishes to proceed.    Ricky DeemMartin U Blakelee Allington, MD Gastroenterology 07/12/2016  1:13 PM

## 2016-07-12 NOTE — Anesthesia Preprocedure Evaluation (Signed)
Anesthesia Evaluation  Patient identified by MRN, date of birth, ID band Patient awake    Reviewed: Allergy & Precautions, H&P , NPO status , Patient's Chart, lab work & pertinent test results, reviewed documented beta blocker date and time   History of Anesthesia Complications (+) PONV and history of anesthetic complications  Airway Mallampati: II  TM Distance: >3 FB Neck ROM: full    Dental no notable dental hx. (+) Teeth Intact   Pulmonary neg pulmonary ROS,    Pulmonary exam normal breath sounds clear to auscultation       Cardiovascular Exercise Tolerance: Good hypertension, (-) angina(-) CAD, (-) Past MI, (-) Cardiac Stents and (-) CABG Normal cardiovascular exam(-) dysrhythmias (-) Valvular Problems/Murmurs Rhythm:regular Rate:Normal     Neuro/Psych negative neurological ROS  negative psych ROS   GI/Hepatic negative GI ROS, Neg liver ROS,   Endo/Other  negative endocrine ROS  Renal/GU Renal disease (kidney stones)  negative genitourinary   Musculoskeletal   Abdominal   Peds  Hematology negative hematology ROS (+)   Anesthesia Other Findings Past Medical History: No date: Complication of anesthesia No date: Herniated disc, cervical No date: HTN (hypertension) No date: Kidney stones No date: PONV (postoperative nausea and vomiting)   Reproductive/Obstetrics negative OB ROS                             Anesthesia Physical Anesthesia Plan  ASA: II  Anesthesia Plan: General   Post-op Pain Management:    Induction:   Airway Management Planned:   Additional Equipment:   Intra-op Plan:   Post-operative Plan:   Informed Consent: I have reviewed the patients History and Physical, chart, labs and discussed the procedure including the risks, benefits and alternatives for the proposed anesthesia with the patient or authorized representative who has indicated his/her  understanding and acceptance.   Dental Advisory Given  Plan Discussed with: Anesthesiologist, CRNA and Surgeon  Anesthesia Plan Comments:         Anesthesia Quick Evaluation

## 2016-07-12 NOTE — Transfer of Care (Signed)
Immediate Anesthesia Transfer of Care Note  Patient: Ricky Heath  Procedure(s) Performed: Procedure(s): COLONOSCOPY WITH PROPOFOL (N/A)  Patient Location: Endoscopy Unit  Anesthesia Type:General  Level of Consciousness: awake, alert , oriented and patient cooperative  Airway & Oxygen Therapy: Patient Spontanous Breathing and Patient connected to nasal cannula oxygen  Post-op Assessment: Report given to RN, Post -op Vital signs reviewed and stable and Patient moving all extremities X 4  Post vital signs: Reviewed and stable  Last Vitals:  Vitals:   07/12/16 1219  BP: 128/89  Pulse: 72  Resp: 18  Temp: 37.3 C    Last Pain:  Vitals:   07/12/16 1219  TempSrc: Tympanic         Complications: No apparent anesthesia complications

## 2016-07-15 ENCOUNTER — Encounter: Payer: Self-pay | Admitting: Gastroenterology

## 2016-07-15 LAB — SURGICAL PATHOLOGY

## 2016-07-15 NOTE — Anesthesia Postprocedure Evaluation (Signed)
Anesthesia Post Note  Patient: Ricky Heath  Procedure(s) Performed: Procedure(s) (LRB): COLONOSCOPY WITH PROPOFOL (N/A)  Patient location during evaluation: Endoscopy Anesthesia Type: General Level of consciousness: awake and alert Pain management: pain level controlled Vital Signs Assessment: post-procedure vital signs reviewed and stable Respiratory status: spontaneous breathing, nonlabored ventilation, respiratory function stable and patient connected to nasal cannula oxygen Cardiovascular status: blood pressure returned to baseline and stable Postop Assessment: no signs of nausea or vomiting Anesthetic complications: no    Last Vitals:  Vitals:   07/12/16 1418 07/12/16 1428  BP: (!) 126/93 (!) 128/98  Pulse: 85 84  Resp: (!) 21 17  Temp:      Last Pain:  Vitals:   07/12/16 1358  TempSrc: Tympanic  PainSc: Asleep                 Lenard SimmerAndrew Karime Scheuermann

## 2016-07-30 ENCOUNTER — Encounter: Payer: Self-pay | Admitting: Internal Medicine

## 2017-04-18 ENCOUNTER — Ambulatory Visit
Admission: EM | Admit: 2017-04-18 | Discharge: 2017-04-18 | Disposition: A | Discharge status: Home / Self Care | Payer: Commercial Managed Care - PPO | Attending: Family Medicine | Admitting: Family Medicine

## 2017-04-18 ENCOUNTER — Encounter: Payer: Self-pay | Admitting: Gynecology

## 2017-04-18 DIAGNOSIS — J069 Acute upper respiratory infection, unspecified: Secondary | ICD-10-CM | POA: Diagnosis not present

## 2017-04-18 DIAGNOSIS — R05 Cough: Secondary | ICD-10-CM

## 2017-04-18 DIAGNOSIS — J0100 Acute maxillary sinusitis, unspecified: Secondary | ICD-10-CM

## 2017-04-18 HISTORY — DX: Type 2 diabetes mellitus without complications: E11.9

## 2017-04-18 LAB — RAPID STREP SCREEN (MED CTR MEBANE ONLY): Streptococcus, Group A Screen (Direct): NEGATIVE

## 2017-04-18 MED ORDER — HYDROCOD POLST-CPM POLST ER 10-8 MG/5ML PO SUER: 5.0000 mL | Freq: Every evening | ORAL | 0 refills | Status: DC | PRN

## 2017-04-18 MED ORDER — DOXYCYCLINE HYCLATE 100 MG PO CAPS: 100.0000 mg | ORAL_CAPSULE | Freq: Two times a day (BID) | ORAL | 0 refills | Status: DC

## 2017-04-18 MED ORDER — ALBUTEROL SULFATE HFA 108 (90 BASE) MCG/ACT IN AERS: 2.0000 | INHALATION_SPRAY | RESPIRATORY_TRACT | 0 refills | Status: DC | PRN

## 2017-04-18 MED ORDER — PREDNISONE 20 MG PO TABS
40.0000 mg | ORAL_TABLET | Freq: Every day | ORAL | 0 refills | Status: DC
Start: 2017-04-18 — End: 2017-10-18

## 2017-04-18 NOTE — ED Triage Notes (Signed)
Patient c/o cough / greenish mucous and sore throat.

## 2017-04-18 NOTE — ED Provider Notes (Signed)
[facility] ____________________________________________  Time seen: Approximately 6:08 PM  I have reviewed the triage vital signs and the nursing notes.   HISTORY  Chief Complaint Sore Throat and Cough  HPI Ricky Heath is a 61 y.o. male presenting for the complaints of 2 weeks of runny nose, nasal congestion, cough and chest congestion. Patient reports primarily chest congestion sensation. Patient reports does feel some drainage in the back of his throat but coughing up greenish mucus. Reports not being able to get much drainage out of his nose. Denies known fevers. Reports some sore throat and states he feels like sore throat is mostly from cough. States unresolved with otc ibuprofen, last dose this am. Denies known sick contacts. Reports continues to eat and drink well. Reports otherwise feels well. States coughing has been intermittently waking him up at night. Denies aggravating or alleviating factors.   Denies chest pain, shortness of breath, abdominal pain, extremity pain, extremity swelling or rash. Denies recent sickness. Denies recent antibiotic use.   Sherlene Shamsullo, Teresa L, MD: PCP   Past Medical History:  Diagnosis Date  . Complication of anesthesia   . Diabetes mellitus without complication (HCC)   . Herniated disc, cervical   . HTN (hypertension)   . Kidney stones   . PONV (postoperative nausea and vomiting)     Patient Active Problem List   Diagnosis Date Noted  . Sinus tachycardia 07/13/2014  . Routine adult health maintenance 07/13/2014  . HNP (herniated nucleus pulposus), lumbar 06/30/2014  . Obesity (BMI 30-39.9) 03/18/2012  . Cough 03/17/2012  . Hypertension 03/17/2012  . UNSPECIFIED ESSENTIAL HYPERTENSION 12/29/2009    Past Surgical History:  Procedure Laterality Date  . BACK SURGERY  2008   . COLONOSCOPY WITH PROPOFOL N/A 07/12/2016   Procedure: COLONOSCOPY WITH PROPOFOL;  Surgeon: Christena DeemMartin U Skul[place]ie, MD;  [facility];  Service:  Endoscopy;  Laterality: N/A;  . KIDNEY STONE SURGERY     had stent placed  . LUMBAR LAMINECTOMY/DECOMPRESSION MICRODISCECTOMY N/A 06/30/2014   Procedure: LUMBAR FIVE- SACRAL ONE LAMINECTOMY/DECOMPRESSION MICRODISCECTOMY ;  Surgeon: Coletta MemosKyle Cabbell, MD;  Location: [facility] NEURO ORS;  Service: Neurosurgery;  Laterality: N/A;  Right redo L5S1 microdiskectomy  . SPINE SURGERY  2008   L5 herniated disk repair, [place]  . TONSILLECTOMY       No current facility-administered medications for this encounter.   Current Outpatient Prescriptions:  .  amLODipine (NORVASC) 5 MG tablet, Take 1 tablet (5 mg total) by mouth daily., Disp: 30 tablet, Rfl: 6 .  metFORMIN (GLUCOPHAGE) 500 MG tablet, Take by mouth 2 (two) times daily with a meal., Disp: , Rfl:  .  albuterol (PROVENTIL HFA;VENTOLIN HFA) 108 (90 Base) MCG/ACT inhaler, Inhale 2 puffs into the lungs every 4 (four) hours as needed for wheezing., Disp: 1 Inhaler, Rfl: 0 .  chlorpheniramine-HYDROcodone (TUSSIONEX PENNKINETIC ER) 10-8 MG/5ML SUER, Take 5 mLs by mouth at bedtime as needed. do not drive or operate machinery while taking as can cause drowsiness., Disp: 75 mL, Rfl: 0 .  doxycycline (VIBRAMYCIN) 100 MG capsule, Take 1 capsule (100 mg total) by mouth 2 (two) times daily., Disp: 20 capsule, Rfl: 0 .  Efinaconazole (JUBLIA) 10 % SOLN, Apply topically., Disp: , Rfl:  .  losartan-hydrochlorothiazide (HYZAAR) 100-25 MG per tablet, Take 1 tablet by mouth daily., Disp: 30 tablet, Rfl: 11 .  omeprazole (PRILOSEC) 40 MG capsule, Take 1 capsule (40 mg total) by mouth daily., Disp: 30 capsule, Rfl: 3 .  predniSONE (DELTASONE) 20 MG  tablet, Take 2 tablets (40 mg total) by mouth daily., Disp: 10 tablet, Rfl: 0 .  traZODone (DESYREL) 50 MG tablet, Take 50 mg by mouth at bedtime., Disp: , Rfl:   Allergies Patient has no known allergies.  Family History  Problem Relation Age of Onset  . Adopted: Yes    Social History Social History  Substance Use Topics  .  Smoking status: Never Smoker  . Smokeless tobacco: Never Used  . Alcohol use No    Review of Systems Constitutional: No fever/chills Eyes: No visual changes. ENT: As above.  Cardiovascular: Denies chest pain. Respiratory: Denies shortness of breath. Gastrointestinal: No abdominal pain.  No nausea, no vomiting.  No diarrhea.  No constipation. Genitourinary: Negative for dysuria. Musculoskeletal: Negative for back pain. Skin: Negative for rash.   ____________________________________________   PHYSICAL EXAM:  VITAL SIGNS: ED Triage Vitals  Enc Vitals Group     BP 04/18/17 1732 136/80     Pulse Rate 04/18/17 1732 96     Resp 04/18/17 1732 16     Temp 04/18/17 1732 99.8 F (37.7 C)     Temp Source 04/18/17 1732 Oral     SpO2 04/18/17 1732 97 %     Weight 04/18/17 1734 208 lb (94.3 kg)     Height 04/18/17 1734 5\' 7"  (1.702 m)     Head Circumference --      Peak Flow --      Pain Score 04/18/17 1735 8     Pain Loc --      Pain Edu? --      Excl. in GC? --    Constitutional: Alert and oriented. Well appearing and in no acute distress. Eyes: Conjunctivae are normal.  Head: Atraumatic.Mild tenderness to palpation bilateral maxillary sinuses; no frontal sinus tenderness to palpation. No swelling. No erythema.   Ears: no erythema, normal TMs bilaterally.   Nose: nasal congestion with bilateral nasal turbinate erythema and edema.   Mouth/Throat: Mucous membranes are moist.  Oropharynx non-erythematous.No tonsillar swelling or exudate.  Neck: No stridor.  No cervical spine tenderness to palpation. Hematological/Lymphatic/Immunilogical: No cervical lymphadenopathy. Cardiovascular: Normal rate, regular rhythm. Grossly normal heart sounds.  Good peripheral circulation. Respiratory: Normal respiratory effort.  No retractions.  No wheezes, rales or rhonchi. Good air movement. Dry intermittent cough noted in room with bronchospasm. Speaks in complete sentences.  Gastrointestinal: Soft  and nontender.  Musculoskeletal:  No cervical, thoracic or lumbar tenderness to palpation.  Neurologic:  Normal speech and language.No gait instability. Skin:  Skin is warm, dry. Psychiatric: Mood and affect are normal. Speech and behavior are normal.  ___________________________________________   LABS (all labs ordered are listed, but only abnormal results are displayed)  Labs Reviewed  RAPID STREP SCREEN (NOT AT [facility])  CULTURE, GROUP A STREP [facility])    RADIOLOGY  No results found. ____________________________________________   PROCEDURES Procedures    INITIAL IMPRESSION / ASSESSMENT AND PLAN / ED COURSE  Pertinent labs & imaging results that were available during my care of the patient were reviewed by me and considered in my medical decision making (see chart for details).  Well appearing patient, no acute distress. Suspect maxillary sinusitis and bronchitis. Will treat with oral doxycycline, prednisone, prn albuterol inhaler and prn Tussionex. Discussed with patient chest xray and will defer xray at this time. Encouraged rest, fluids, and supportive care. Discussed indication, risks and benefits of medications with patient.  Discussed follow up with Primary care physician this week. Discussed follow up and  return parameters including no resolution or any worsening concerns. Patient verbalized understanding and agreed to plan.   ____________________________________________   FINAL CLINICAL IMPRESSION(S) / ED DIAGNOSES  Final diagnoses:  Acute maxillary sinusitis, recurrence not specified  Upper respiratory infection with cough and congestion     Discharge Medication List as of 04/18/2017  6:20 PM    START taking these medications   Details  albuterol (PROVENTIL HFA;VENTOLIN HFA) 108 (90 Base) MCG/ACT inhaler Inhale 2 puffs into the lungs every 4 (four) hours as needed for wheezing., Starting Fri 04/18/2017, Normal    doxycycline (VIBRAMYCIN) 100 MG capsule Take 1  capsule (100 mg total) by mouth 2 (two) times daily., Starting Fri 04/18/2017, Normal    predniSONE (DELTASONE) 20 MG tablet Take 2 tablets (40 mg total) by mouth daily., Starting Fri 04/18/2017, Normal    chlorpheniramine-HYDROcodone (TUSSIONEX PENNKINETIC ER) 10-8 MG/5ML SUER Take 5 mLs by mouth at bedtime as needed. do not drive or operate machinery while taking as can cause drowsiness., Starting Fri 04/18/2017, Print        Note: This dictation was prepared with Dragon dictation along with smaller phrase technology. Any transcriptional errors that result from this process are unintentional.         Renford Dills, NP 04/18/17 [phone]

## 2017-04-18 NOTE — Discharge Instructions (Signed)
Take medication as prescribed. Rest. Drink plenty of fluids.  ° °Follow up with your primary care physician this week as needed. Return to Urgent care for new or worsening concerns.  ° °

## 2017-04-21 LAB — CULTURE, GROUP A STREP (THRC)

## 2017-10-18 ENCOUNTER — Ambulatory Visit
Admission: EM | Admit: 2017-10-18 | Discharge: 2017-10-18 | Disposition: A | Discharge status: Home / Self Care | Payer: Commercial Managed Care - PPO | Attending: Family Medicine | Admitting: Family Medicine

## 2017-10-18 ENCOUNTER — Encounter: Payer: Self-pay | Admitting: Emergency Medicine

## 2017-10-18 ENCOUNTER — Other Ambulatory Visit: Payer: Self-pay

## 2017-10-18 DIAGNOSIS — R05 Cough: Secondary | ICD-10-CM

## 2017-10-18 DIAGNOSIS — J029 Acute pharyngitis, unspecified: Secondary | ICD-10-CM | POA: Diagnosis not present

## 2017-10-18 DIAGNOSIS — J069 Acute upper respiratory infection, unspecified: Secondary | ICD-10-CM | POA: Diagnosis not present

## 2017-10-18 MED ORDER — PREDNISONE 20 MG PO TABS: ORAL_TABLET | ORAL | 0 refills | Status: DC

## 2017-10-18 MED ORDER — HYDROCOD POLST-CPM POLST ER 10-8 MG/5ML PO SUER: 5.0000 mL | Freq: Two times a day (BID) | ORAL | 0 refills | Status: AC

## 2017-10-18 MED ORDER — ALBUTEROL SULFATE HFA 108 (90 BASE) MCG/ACT IN AERS: 1.0000 | INHALATION_SPRAY | Freq: Four times a day (QID) | RESPIRATORY_TRACT | 0 refills | Status: DC | PRN

## 2017-10-18 MED ORDER — BENZONATATE 200 MG PO CAPS: ORAL_CAPSULE | ORAL | 0 refills | Status: DC

## 2017-10-18 NOTE — ED Provider Notes (Signed)
[facility]    CSN: [id number] Arrival date & time: 10/18/17  0809     History   Chief Complaint Chief Complaint  Patient presents with  . Cough  . Sore Throat  . Generalized Body Aches    HPI Yaakov GuthrieRobert A Zenk is a 61 y.o. male.   HPI  This a 61 year old male who presents with a 2-day history of cough congestion sore throat body aches.  He has had no fever or chills.  The cough has been productive of clear to yellow to green sputum.  He was seen on 09/03/2017 by his primary care physician diagnosed with a sign of bronchitis and placed on Ceftin.  The patient has done well until this time.  He also has been seen on 04/18/2017 with acute maxillary sinusitis treated with doxycycline.  At the present time he is afebrile.  O2 sats on room air 99%.  Patient does not complain of nasal ingestion mostly the cough and the tightness in his chest when he does cough.        Past Medical History:  Diagnosis Date  . Complication of anesthesia   . Diabetes mellitus without complication (HCC)   . Herniated disc, cervical   . HTN (hypertension)   . Kidney stones   . PONV (postoperative nausea and vomiting)     Patient Active Problem List   Diagnosis Date Noted  . Sinus tachycardia 07/13/2014  . Routine adult health maintenance 07/13/2014  . HNP (herniated nucleus pulposus), lumbar 06/30/2014  . Obesity (BMI 30-39.9) 03/18/2012  . Cough 03/17/2012  . Hypertension 03/17/2012  . UNSPECIFIED ESSENTIAL HYPERTENSION 12/29/2009    Past Surgical History:  Procedure Laterality Date  . BACK SURGERY  2008   . COLONOSCOPY WITH PROPOFOL N/A 07/12/2016   Procedure: COLONOSCOPY WITH PROPOFOL;  Surgeon: Christena DeemMartin U Skulskie, MD;  [facility];  Service: Endoscopy;  Laterality: N/A;  . KIDNEY STONE SURGERY     had stent placed  . LUMBAR LAMINECTOMY/DECOMPRESSION MICRODISCECTOMY N/A 06/30/2014   Procedure: LUMBAR FIVE- SACRAL ONE LAMINECTOMY/DECOMPRESSION MICRODISCECTOMY ;   Surgeon: Coletta MemosKyle Cabbell, MD;  Location: [facility] NEURO ORS;  Service: Neurosurgery;  Laterality: N/A;  Right redo L5S1 microdiskectomy  . SPINE SURGERY  2008   L5 herniated disk repair, [place]  . TONSILLECTOMY         Home Medications    Prior to Admission medications   Medication Sig Start Date End Date Taking? Authorizing Provider  amLODipine (NORVASC) 5 MG tablet Take 1 tablet (5 mg total) by mouth daily. 07/20/14  Yes Gollan, Tollie Pizzaimothy J, MD  losartan-hydrochlorothiazide (HYZAAR) 100-25 MG per tablet Take 1 tablet by mouth daily. 07/13/14  Yes Gollan, Tollie Pizzaimothy J, MD  metFORMIN (GLUCOPHAGE) 500 MG tablet Take by mouth 2 (two) times daily with a meal.   Yes [provider]  omeprazole (PRILOSEC) 40 MG capsule Take 1 capsule (40 mg total) by mouth daily. 03/17/12 10/18/17 Yes Sherlene Shamsullo, Teresa L, MD  traZODone (DESYREL) 50 MG tablet Take 50 mg by mouth at bedtime.   Yes [provider]  albuterol (PROVENTIL HFA;VENTOLIN HFA) 108 (90 Base) MCG/ACT inhaler Inhale 1-2 puffs into the lungs every 6 (six) hours as needed for wheezing or shortness of breath. Use with spacer 10/18/17   Lutricia Feiloemer, Lando Alcalde P, PA-C  benzonatate (TESSALON) 200 MG capsule Take one cap TID PRN cough 10/18/17   Lutricia Feiloemer, Mae Cianci P, PA-C  chlorpheniramine-HYDROcodone [facility](TUSSIONEX PENNKINETIC ER) 10-8 MG/5ML SUER Take 5 mLs by mouth 2 (two) times daily.  10/18/17   Lutricia Feil, PA-C  Efinaconazole (JUBLIA) 10 % SOLN Apply topically.    [provider]  predniSONE (DELTASONE) 20 MG tablet Take 2 tablets (40 mg) daily by mouth 10/18/17   Lutricia Feil, PA-C    Family History Family History  Adopted: Yes    Social History Social History   Tobacco Use  . Smoking status: Never Smoker  . Smokeless tobacco: Never Used  Substance Use Topics  . Alcohol use: No  . Drug use: No     Allergies   Patient has no known allergies.   Review of Systems Review of Systems  Constitutional: Positive for activity  change. Negative for chills, fatigue and fever.  HENT: Positive for sinus pressure and sinus pain.   Respiratory: Positive for cough. Negative for shortness of breath, wheezing and stridor.   All other systems reviewed and are negative.    Physical Exam Triage Vital Signs ED Triage Vitals  Enc Vitals Group     BP 10/18/17 0822 (!) 143/87     Pulse Rate 10/18/17 0822 90     Resp 10/18/17 0822 16     Temp 10/18/17 0822 98.6 F (37 C)     Temp Source 10/18/17 0822 Oral     SpO2 10/18/17 0822 99 %     Weight 10/18/17 0820 200 lb (90.7 kg)     Height 10/18/17 0820 5\' 7"  (1.702 m)     Head Circumference --      Peak Flow --      Pain Score 10/18/17 0820 7     Pain Loc --      Pain Edu? --      Excl. in [place]? --    No data found.  Updated Vital Signs BP (!) 143/87 (BP Location: Left Arm)   Pulse 90   Temp 98.6 F (37 C) (Oral)   Resp 16   Ht 5\' 7"  (1.702 m)   Wt 200 lb (90.7 kg)   SpO2 99%   BMI 31.32 kg/m   Visual Acuity Right Eye Distance:   Left Eye Distance:   Bilateral Distance:    Right Eye Near:   Left Eye Near:    Bilateral Near:     Physical Exam  Constitutional: He is oriented to person, place, and time. He appears well-developed and well-nourished.  Non-toxic appearance. He does not appear ill. No distress.  HENT:  Head: Normocephalic.  Right Ear: Tympanic membrane normal.  Left Ear: Tympanic membrane normal.  Mouth/Throat: Oropharynx is clear and moist and mucous membranes are normal. No oropharyngeal exudate, posterior oropharyngeal edema or posterior oropharyngeal erythema.  Eyes: Pupils are equal, round, and reactive to light.  Neck: Normal range of motion. Neck supple.  Pulmonary/Chest: Effort normal and breath sounds normal.  Abdominal: Soft.  Neurological: He is alert and oriented to person, place, and time.  Skin: Skin is warm and dry.  Psychiatric: He has a normal mood and affect. His behavior is normal.  Nursing note and vitals  reviewed.    UC Treatments / Results  Labs (all labs ordered are listed, but only abnormal results are displayed) Labs Reviewed - No data to display  EKG  EKG Interpretation None       Radiology No results found.  Procedures Procedures (including critical care time)  Medications Ordered in UC Medications - No data to display   Initial Impression / Assessment and Plan / UC Course  I have reviewed the triage vital  signs and the nursing notes.  Pertinent labs & imaging results that were available during my care of the patient were reviewed by me and considered in my medical decision making (see chart for details).     Plan: 1. Test/x-ray results and diagnosis reviewed with patient 2. rx as per orders; risks, benefits, potential side effects reviewed with patient 3. Recommend supportive treatment with fluids and rest.  I told him we will treat this symptomatically as since it appears to be a viral infection and not bacterial.  If he continues to have his problem coughing with any shortness of breath or high fevers he should return to our clinic or for see his primary care physician for further evaluation. 4. F/u prn if symptoms worsen or don't improve   Final Clinical Impressions(s) / UC Diagnoses   Final diagnoses:  Upper respiratory tract infection, unspecified type    ED Discharge Orders        Ordered    chlorpheniramine-HYDROcodone (TUSSIONEX PENNKINETIC ER) 10-8 MG/5ML SUER  2 times daily     10/18/17 0918    benzonatate (TESSALON) 200 MG capsule     10/18/17 0918    predniSONE (DELTASONE) 20 MG tablet     10/18/17 0918    albuterol (PROVENTIL HFA;VENTOLIN HFA) 108 (90 Base) MCG/ACT inhaler  Every 6 hours PRN    Comments:  Provide spacer and instructions to patient   10/18/17 [id number]       Controlled Substance Prescriptions Bureau Controlled Substance Registry consulted? Not Applicable   Lutricia FeilRoemer, Kedarius Aloisi P, PA-C 10/18/17 [id number]

## 2017-10-18 NOTE — ED Triage Notes (Signed)
Patient c/o cough, congestion, sore throat and bodyaches that started Thursday.  Patient denies fevers.

## 2018-12-08 DIAGNOSIS — E119 Type 2 diabetes mellitus without complications: Secondary | ICD-10-CM | POA: Diagnosis not present

## 2018-12-08 DIAGNOSIS — I10 Essential (primary) hypertension: Secondary | ICD-10-CM | POA: Diagnosis not present

## 2018-12-08 DIAGNOSIS — Z79899 Other long term (current) drug therapy: Secondary | ICD-10-CM | POA: Diagnosis not present

## 2018-12-21 DIAGNOSIS — H902 Conductive hearing loss, unspecified: Secondary | ICD-10-CM | POA: Diagnosis not present

## 2018-12-21 DIAGNOSIS — H6123 Impacted cerumen, bilateral: Secondary | ICD-10-CM | POA: Diagnosis not present

## 2019-03-19 ENCOUNTER — Other Ambulatory Visit: Payer: Self-pay

## 2019-03-19 ENCOUNTER — Encounter: Payer: Self-pay | Admitting: Podiatry

## 2019-03-19 ENCOUNTER — Ambulatory Visit (INDEPENDENT_AMBULATORY_CARE_PROVIDER_SITE_OTHER): Payer: Commercial Managed Care - PPO

## 2019-03-19 ENCOUNTER — Ambulatory Visit: Payer: Commercial Managed Care - PPO | Admitting: Podiatry

## 2019-03-19 VITALS — BP 146/90 | HR 91 | Temp 97.4°F | Resp 16

## 2019-03-19 DIAGNOSIS — M722 Plantar fascial fibromatosis: Secondary | ICD-10-CM

## 2019-03-19 MED ORDER — MELOXICAM 15 MG PO TABS: 15.0000 mg | ORAL_TABLET | Freq: Every day | ORAL | 1 refills | Status: DC

## 2019-03-19 NOTE — Progress Notes (Signed)
   Subjective:    Patient ID: Ricky Heath, male    DOB: 09-Aug-1956, 63 y.o.   MRN: [id number]  HPI    Review of Systems  Musculoskeletal: Positive for arthralgias and myalgias.  All other systems reviewed and are negative.      Objective:   Physical Exam        Assessment & Plan:

## 2019-03-19 NOTE — Patient Instructions (Signed)

## 2019-03-22 NOTE — Progress Notes (Signed)
   Subjective: 63 y.o. male presenting today as a new patient with a chief complaint of pain to the plantar aspect of the left heel that began about one week ago. He describes the pain as sharp and states he was diagnosed with plantar fasciitis about one year ago. Walking increases the pain. He has been taking OTC Aleve for treatment. Patient is here for further evaluation and treatment.   Past Medical History:  Diagnosis Date  . Complication of anesthesia   . Diabetes mellitus without complication (HCC)   . Herniated disc, cervical   . HTN (hypertension)   . Kidney stones   . PONV (postoperative nausea and vomiting)      Objective: Physical Exam General: The patient is alert and oriented x3 in no acute distress.  Dermatology: Skin is warm, dry and supple bilateral lower extremities. Negative for open lesions or macerations bilateral.   Vascular: Dorsalis Pedis and Posterior Tibial pulses palpable bilateral.  Capillary fill time is immediate to all digits.  Neurological: Epicritic and protective threshold intact bilateral.   Musculoskeletal: Tenderness to palpation to the plantar aspect of the left heel along the plantar fascia. All other joints range of motion within normal limits bilateral. Strength 5/5 in all groups bilateral.   Radiographic exam: Normal osseous mineralization. Joint spaces preserved. No fracture/dislocation/boney destruction. No other soft tissue abnormalities or radiopaque foreign bodies.   Assessment: 1. Plantar fasciitis left foot  Plan of Care:  1. Patient evaluated. Xrays reviewed.   2. Injection of 0.5cc Celestone soluspan injected into the left plantar fascia.  3. Rx for Meloxicam ordered for patient. 4. Continue wearing New Balance shoes.  5. Return to clinic in 4 weeks.   Spouse is Rudolpho Sevin.     Felecia Shelling, DPM [facility]  Dr. Felecia Shelling, DPM    [address] [place],  [place] [address]                Office [phone]  Fax [phone]

## 2019-03-26 ENCOUNTER — Ambulatory Visit: Payer: Self-pay | Admitting: Podiatry

## 2019-04-16 ENCOUNTER — Ambulatory Visit: Payer: Commercial Managed Care - PPO | Admitting: Podiatry

## 2019-06-15 ENCOUNTER — Other Ambulatory Visit: Payer: Self-pay | Admitting: Nurse Practitioner

## 2019-06-15 DIAGNOSIS — R7989 Other specified abnormal findings of blood chemistry: Secondary | ICD-10-CM

## 2019-06-30 ENCOUNTER — Ambulatory Visit
Admission: RE | Admit: 2019-06-30 | Discharge: 2019-06-30 | Disposition: A | Discharge status: Home / Self Care | Payer: Commercial Managed Care - PPO | Source: Ambulatory Visit | Attending: Nurse Practitioner | Admitting: Nurse Practitioner

## 2019-06-30 ENCOUNTER — Other Ambulatory Visit: Payer: Self-pay

## 2019-06-30 DIAGNOSIS — R945 Abnormal results of liver function studies: Secondary | ICD-10-CM | POA: Insufficient documentation

## 2019-06-30 DIAGNOSIS — R7989 Other specified abnormal findings of blood chemistry: Secondary | ICD-10-CM

## 2019-10-27 ENCOUNTER — Ambulatory Visit: Payer: Commercial Managed Care - PPO | Attending: Internal Medicine

## 2019-10-27 DIAGNOSIS — Z20822 Contact with and (suspected) exposure to covid-19: Secondary | ICD-10-CM

## 2019-10-29 LAB — NOVEL CORONAVIRUS, NAA: SARS-CoV-2, NAA: NOT DETECTED

## 2019-11-01 ENCOUNTER — Ambulatory Visit: Payer: Commercial Managed Care - PPO | Attending: Internal Medicine

## 2019-11-01 DIAGNOSIS — Z20822 Contact with and (suspected) exposure to covid-19: Secondary | ICD-10-CM

## 2019-11-03 LAB — NOVEL CORONAVIRUS, NAA: SARS-CoV-2, NAA: DETECTED — AB

## 2020-03-03 ENCOUNTER — Ambulatory Visit: Payer: Commercial Managed Care - PPO | Admitting: Podiatry

## 2020-03-03 ENCOUNTER — Other Ambulatory Visit: Payer: Self-pay

## 2020-03-03 DIAGNOSIS — L600 Ingrowing nail: Secondary | ICD-10-CM

## 2020-03-03 MED ORDER — GENTAMICIN SULFATE 0.1 % EX CREA
1.0000 | TOPICAL_CREAM | Freq: Two times a day (BID) | CUTANEOUS | 1 refills | Status: AC
Start: 2020-03-03 — End: ?

## 2020-03-08 NOTE — Progress Notes (Signed)
   Subjective: Patient presents today for evaluation of pain to the medial border of the left great toe and the lateral border of the right great toe that began about three weeks ago. He reports associated swelling and redness of the toes. Patient is concerned for possible ingrown nail. Walking and wearing certain shoes increases the pain. He has been trimming the nails himself for treatment. Patient presents today for further treatment and evaluation.  Past Medical History:  Diagnosis Date  . Complication of anesthesia   . Diabetes mellitus without complication (HCC)   . Herniated disc, cervical   . HTN (hypertension)   . Kidney stones   . PONV (postoperative nausea and vomiting)     Objective:  General: Well developed, nourished, in no acute distress, alert and oriented x3   Dermatology: Skin is warm, dry and supple bilateral. Medial border of the left hallux and lateral border of the right hallux appears to be erythematous with evidence of an ingrowing nail. Pain on palpation noted to the border of the nail fold. The remaining nails appear unremarkable at this time. There are no open sores, lesions.  Vascular: Dorsalis Pedis artery and Posterior Tibial artery pedal pulses palpable. No lower extremity edema noted.   Neruologic: Grossly intact via light touch bilateral.  Musculoskeletal: Muscular strength within normal limits in all groups bilateral. Normal range of motion noted to all pedal and ankle joints.   Assesement: #1 Paronychia with ingrowing nail medial border left hallux, lateral border right hallux #2 Pain in toe #3 Incurvated nail  Plan of Care:  1. Patient evaluated.  2. Discussed treatment alternatives and plan of care. Explained nail avulsion procedure and post procedure course to patient. 3. Patient opted for permanent partial nail avulsion of the medial border left hallux, lateral border right hallux.  4. Prior to procedure, local anesthesia infiltration utilized  using 3 ml of a 50:50 mixture of 2% plain lidocaine and 0.5% plain marcaine in a normal hallux block fashion and a betadine prep performed.  5. Partial permanent nail avulsion with chemical matrixectomy performed using 3x30sec applications of phenol followed by alcohol flush.  6. Light dressing applied. 7. Prescription for Gentamicin cream provided to patient to use daily with a bandage.  8. Post op shoes dispensed.  9. Return to clinic in 2 weeks.  Felecia Shelling, DPM [facility]  Dr. Felecia Shelling, DPM    [address]                                        [place], [place] [address]                Office [phone]  Fax [phone]

## 2020-03-10 ENCOUNTER — Encounter: Payer: Commercial Managed Care - PPO | Admitting: Podiatry

## 2020-03-17 ENCOUNTER — Ambulatory Visit: Payer: Commercial Managed Care - PPO | Admitting: Podiatry

## 2020-03-17 ENCOUNTER — Other Ambulatory Visit: Payer: Self-pay

## 2020-03-17 DIAGNOSIS — L600 Ingrowing nail: Secondary | ICD-10-CM

## 2020-03-20 NOTE — Progress Notes (Signed)
   Subjective: 64 y.o. male presents today status post permanent nail avulsion procedure of the lateral border of the right hallux and the medial border of the left hallux that was performed on 03/03/2020. He states he is doing well overall. He reports some redness of the left great toe and soreness around both areas. He has been using the Gentamicin cream as directed. There are no worsening factors noted. Patient is here for further evaluation and treatment.    Past Medical History:  Diagnosis Date  . Complication of anesthesia   . Diabetes mellitus without complication (HCC)   . Herniated disc, cervical   . HTN (hypertension)   . Kidney stones   . PONV (postoperative nausea and vomiting)     Objective: Skin is warm, dry and supple. Nail and respective nail fold appears to be healing appropriately. Open wound to the associated nail fold with a granular wound base and moderate amount of fibrotic tissue. Minimal drainage noted. Mild erythema around the periungual region likely due to phenol chemical matricectomy.  Assessment: #1 postop permanent partial nail avulsion lateral border right hallux; medial border left hallux  #2 open wound periungual nail fold of respective digit.   Plan of care: #1 patient was evaluated  #2 debridement of open wound was performed to the periungual border of the respective toe using a currette. Antibiotic ointment and Band-Aid was applied. #3 patient is to return to clinic on a PRN basis.   Felecia Shelling, DPM [facility]  Dr. Felecia Shelling, DPM    [address]                                        [place], [place] [address]                Office [phone]  Fax [phone]

## 2020-11-21 ENCOUNTER — Other Ambulatory Visit: Payer: Self-pay | Admitting: Neurosurgery

## 2020-11-21 DIAGNOSIS — M48061 Spinal stenosis, lumbar region without neurogenic claudication: Secondary | ICD-10-CM

## 2020-11-26 ENCOUNTER — Other Ambulatory Visit: Payer: Self-pay

## 2020-11-26 ENCOUNTER — Ambulatory Visit
Admission: RE | Admit: 2020-11-26 | Discharge: 2020-11-26 | Disposition: A | Discharge status: Home / Self Care | Payer: Commercial Managed Care - PPO | Source: Ambulatory Visit | Attending: Neurosurgery | Admitting: Neurosurgery

## 2020-11-26 DIAGNOSIS — M48061 Spinal stenosis, lumbar region without neurogenic claudication: Secondary | ICD-10-CM | POA: Diagnosis present

## 2020-11-26 MED ORDER — GADOBUTROL 1 MMOL/ML IV SOLN
10.0000 mL | Freq: Once | INTRAVENOUS | Status: AC | PRN
  Administered 2020-11-26: 10 mL via INTRAVENOUS

## 2022-10-23 ENCOUNTER — Ambulatory Visit: Payer: Self-pay

## 2022-10-23 ENCOUNTER — Ambulatory Visit
Admission: RE | Admit: 2022-10-23 | Discharge: 2022-10-23 | Disposition: A | Discharge status: Home / Self Care | Payer: Commercial Managed Care - PPO | Source: Ambulatory Visit

## 2022-10-23 VITALS — BP 112/68 | HR 105 | Temp 97.8°F | Resp 17

## 2022-10-23 DIAGNOSIS — R197 Diarrhea, unspecified: Secondary | ICD-10-CM | POA: Diagnosis not present

## 2022-10-23 MED ORDER — CIPROFLOXACIN HCL 500 MG PO TABS: 500.0000 mg | ORAL_TABLET | Freq: Two times a day (BID) | ORAL | 0 refills | Status: AC

## 2022-10-23 NOTE — ED Triage Notes (Signed)
Pt. Presents to UC w/ c/o diarrhea and fatigue for the past 5 days. Pt. Also states he had chills 5 days ago but it has now resolved.

## 2022-10-23 NOTE — Discharge Instructions (Signed)
Watch for worsening signs and symptoms of dehydration including reduced urine output.  Go to the ED for fluid replenishment via ID if necessary.  Follow up here or with your primary care provider if your symptoms are worsening or not improving with treatment.

## 2022-10-23 NOTE — ED Provider Notes (Signed)
[facility]    CSN: [id number] Arrival date & time: 10/23/22  0857      History   Chief Complaint Chief Complaint  Patient presents with   Diarrhea    Entered by patient   Fatigue    HPI IYAN FLETT is a 66 y.o. male.    Diarrhea   Presents to urgent care with 5-day history of diarrhea and fatigue.  He endorses chills 5 days ago which is now resolved.  Presents with elevated heart rate of 105 bpm.  He is using Imodium A-D at the maximum dosage per box recommendations.  He endorses profound feeling of fatigue.  He states he is attempting to rehydrate self with Gatorade and water.  Endorses continued at least 3 episodes of very explosive, liquid diarrhea daily even with use of Imodium antidiarrheal agent.  Past Medical History:  Diagnosis Date   Complication of anesthesia    Diabetes mellitus without complication ([place])    Herniated disc, cervical    HTN (hypertension)    Kidney stones    PONV (postoperative nausea and vomiting)     Patient Active Problem List   Diagnosis Date Noted   Diabetes mellitus, type 2 ([place]) 04/12/2016   Sinus tachycardia 07/13/2014   Routine adult health maintenance 07/13/2014   HNP (herniated nucleus pulposus), lumbar 06/30/2014   Hydronephrosis 05/24/2013   Hypertrophy of prostate with urinary obstruction and other lower urinary tract symptoms (LUTS) 05/24/2013   Kidney stone 53/29/9242   Renal colic 68/34/1962   Obesity (BMI 30-39.9) 03/18/2012   Cough 03/17/2012   Hypertension 03/17/2012   UNSPECIFIED ESSENTIAL HYPERTENSION 12/29/2009    Past Surgical History:  Procedure Laterality Date   BACK SURGERY  2008    COLONOSCOPY WITH PROPOFOL N/A 07/12/2016   Procedure: COLONOSCOPY WITH PROPOFOL;  Surgeon: Lollie Sails, MD;  Location: [facility] ENDOSCOPY;  Service: Endoscopy;  Laterality: N/A;   KIDNEY STONE SURGERY     had stent placed   LUMBAR LAMINECTOMY/DECOMPRESSION MICRODISCECTOMY N/A 06/30/2014   Procedure: LUMBAR  FIVE- SACRAL ONE LAMINECTOMY/DECOMPRESSION MICRODISCECTOMY ;  Surgeon: Ashok Pall, MD;  Location: [place] [facility]URO ORS;  Service: Neurosurgery;  Laterality: N/A;  Right redo L5S1 microdiskectomy   SPINE SURGERY  2008   L5 herniated disk repair, [place] Medications    Prior to Admission medications   Medication Sig Start Date End Date Taking? Authorizing Provider  Blood Glucose Monitoring Suppl (FIFTY50 GLUCOSE METER 2.0) w/Device KIT Use three times daily to test blood glucose as directed 08/10/19  Yes [provider]  EPINEPHrine 0.3 mg/0.3 mL IJ SOAJ injection as needed. 01/09/22  Yes [provider]  glucose blood (PRECISION QID TEST) test strip Use 1 each (1 strip total) 3 (three) times daily for 90 days 08/10/19  Yes [provider]  albuterol (PROVENTIL HFA;VENTOLIN HFA) 108 (90 Base) MCG/ACT inhaler Inhale 1-2 puffs into the lungs every 6 (six) hours as needed for wheezing or shortness of breath. Use with spacer 10/18/17   Lorin Picket, PA-C  ALPRAZolam Duanne Moron) 1 MG tablet Take 1 mg by mouth. 06/16/13   [provider]  amLODipine (NORVASC) 5 MG tablet Take 1 tablet (5 mg total) by mouth daily. 07/20/14   Minna Merritts, MD  benzonatate (TESSALON) 200 MG capsule Take one cap TID PRN cough 10/18/17   Crecencio Mc P, PA-C  cetirizine (ZYRTEC) 10 MG tablet Take by mouth.    [provider]  chlorpheniramine-HYDROcodone (TUSSIONEX PENNKINETIC ER) 10-8 MG/5ML SUER Take 5 mLs by mouth 2 (two) times daily. 10/18/17   Lorin Picket, PA-C  Efinaconazole (JUBLIA) 10 % SOLN Apply topically.    [provider]  gentamicin cream (GARAMYCIN) 0.1 % Apply 1 application topically 2 (two) times daily. 03/03/20   Edrick Kins, DPM  HYDROcodone-acetaminophen (NORCO/VICODIN) 5-325 MG tablet Take by mouth. 06/01/13   [provider]  ibuprofen (ADVIL) 200 MG tablet Take by mouth.    [provider]   JARDIANCE 25 MG TABS tablet Take 25 mg by mouth daily.    [provider]  losartan (COZAAR) 100 MG tablet Take by mouth. 05/19/17   [provider]  losartan-hydrochlorothiazide (HYZAAR) 100-25 MG per tablet Take 1 tablet by mouth daily. 07/13/14   Minna Merritts, MD  meloxicam (MOBIC) 15 MG tablet Take 1 tablet (15 mg total) by mouth daily. 03/19/19   Edrick Kins, DPM  metFORMIN (GLUCOPHAGE) 500 MG tablet Take by mouth 2 (two) times daily with a meal.    [provider]  omeprazole (PRILOSEC) 40 MG capsule Take 1 capsule (40 mg total) by mouth daily. 03/17/12 10/18/17  Crecencio Mc, MD  ondansetron (ZOFRAN) 4 MG tablet Take 4 mg by mouth. 06/01/13   [provider]  pioglitazone (ACTOS) 30 MG tablet Take 30 mg by mouth daily.    [provider]  predniSONE (DELTASONE) 20 MG tablet Take 2 tablets (40 mg) daily by mouth 10/18/17   Lorin Picket, PA-C  tamsulosin [facility]) 0.4 MG CAPS capsule  05/24/13   [provider]  traZODone (DESYREL) 50 MG tablet Take 50 mg by mouth at bedtime.    [provider]    Family History Family History  Adopted: Yes    Social History Social History   Tobacco Use   Smoking status: Never   Smokeless tobacco: Never  Vaping Use   Vaping Use: Never used  Substance Use Topics   Alcohol use: No   Drug use: No     Allergies   Benazepril   Review of Systems Review of Systems  Gastrointestinal:  Positive for diarrhea.     Physical Exam Triage Vital Signs ED Triage Vitals [10/23/22 0909]  Enc Vitals Group     BP 112/68     Pulse Rate (!) 105     Resp 17     Temp 97.8 F (36.6 C)     Temp src      SpO2 95 %     Weight      Height      Head Circumference      Peak Flow      Pain Score 0     Pain Loc      Pain Edu?      Excl. in [place]?    No data found.  Updated Vital Signs BP 112/68   Pulse (!) 105   Temp 97.8 F (36.6 C)   Resp 17   SpO2 95%   Visual  Acuity Right Eye Distance:   Left Eye Distance:   Bilateral Distance:    Right Eye Near:   Left Eye Near:    Bilateral Near:     Physical Exam Vitals reviewed.  Constitutional:      Appearance: Normal appearance. He is ill-appearing. He is not diaphoretic.  HENT:     Mouth/Throat:     Mouth: Mucous membranes are dry.  Cardiovascular:  Rate and Rhythm: Regular rhythm. Tachycardia present.     Pulses: Normal pulses.     Heart sounds: Normal heart sounds.  Pulmonary:     Effort: Pulmonary effort is normal.     Breath sounds: Normal breath sounds.  Skin:    General: Skin is warm and dry.  Neurological:     General: No focal deficit present.     Mental Status: He is alert and oriented to person, place, and time.  Psychiatric:        Mood and Affect: Mood normal.        Behavior: Behavior normal.      UC Treatments / Results  Labs (all labs ordered are listed, but only abnormal results are displayed) Labs Reviewed - No data to display  EKG   Radiology No results found.  Procedures Procedures (including critical care time)  Medications Ordered in UC Medications - No data to display  Initial Impression / Assessment and Plan / UC Course  I have reviewed the triage vital signs and the nursing notes.  Pertinent labs & imaging results that were available during my care of the patient were reviewed by me and considered in my medical decision making (see chart for details).   Patient is afebrile here without recent antipyretics. Satting well on room air, elevated heart rate of 105 bpm. Overall is ill appearing, dry, without respiratory distress.  Concern for possible developing dehydration and caution patient to continue to aggressively replenish fluids and electrolytes.  Will give Cipro twice daily x 5 days for presumed infectious diarrhea.   Final Clinical Impressions(s) / UC Diagnoses   Final diagnoses:  None   Discharge Instructions   None    ED  Prescriptions   None    PDMP not reviewed this encounter.   Rose Phi, [place] 10/23/22 [phone]

## 2023-07-01 ENCOUNTER — Ambulatory Visit (INDEPENDENT_AMBULATORY_CARE_PROVIDER_SITE_OTHER): Payer: Medicare HMO

## 2023-07-01 ENCOUNTER — Ambulatory Visit (INDEPENDENT_AMBULATORY_CARE_PROVIDER_SITE_OTHER): Payer: Medicare HMO | Admitting: Podiatry

## 2023-07-01 ENCOUNTER — Encounter: Payer: Self-pay | Admitting: Podiatry

## 2023-07-01 DIAGNOSIS — M7752 Other enthesopathy of left foot: Secondary | ICD-10-CM

## 2023-07-01 DIAGNOSIS — M7732 Calcaneal spur, left foot: Secondary | ICD-10-CM | POA: Diagnosis not present

## 2023-07-01 MED ORDER — BETAMETHASONE SOD PHOS & ACET 6 (3-3) MG/ML IJ SUSP
3.0000 mg | Freq: Once | INTRAMUSCULAR | Status: AC
Start: 2023-07-01 — End: ?

## 2023-07-01 MED ORDER — MELOXICAM 15 MG PO TABS: 15.0000 mg | ORAL_TABLET | Freq: Every day | ORAL | 1 refills | Status: DC

## 2023-07-01 NOTE — Progress Notes (Signed)
   Chief Complaint  Patient presents with   Foot Pain    Rm7: patient is here for left foot pain on exterior heel to toe     HPI: 67 y.o. male presenting today as a reestablish new patient for evaluation of pain to the lateral aspect of the left forefoot.  Idiopathic onset about 2 weeks ago.  Denies any history of injury.  He does say that several months ago he sustained a tripping incident on his left foot but only began to have pain a few weeks ago.  Currently taking ibuprofen for the pain  Past Medical History:  Diagnosis Date   Complication of anesthesia    Diabetes mellitus without complication (HCC)    Herniated disc, cervical    HTN (hypertension)    Kidney stones    PONV (postoperative nausea and vomiting)     Past Surgical History:  Procedure Laterality Date   BACK SURGERY  2008    COLONOSCOPY WITH PROPOFOL N/A 07/12/2016   Procedure: COLONOSCOPY WITH PROPOFOL;  Surgeon: Christena Deem, MD;  Location: [facility] ENDOSCOPY;  Service: Endoscopy;  Laterality: N/A;   KIDNEY STONE SURGERY     had stent placed   LUMBAR LAMINECTOMY/DECOMPRESSION MICRODISCECTOMY N/A 06/30/2014   Procedure: LUMBAR FIVE- SACRAL ONE LAMINECTOMY/DECOMPRESSION MICRODISCECTOMY ;  Surgeon: Coletta Memos, MD;  Location: [facility] NEURO ORS;  Service: Neurosurgery;  Laterality: N/A;  Right redo L5S1 microdiskectomy   SPINE SURGERY  2008   L5 herniated disk repair, [place]   TONSILLECTOMY      Allergies  Allergen Reactions   Benazepril Cough     Physical Exam: General: The patient is alert and oriented x3 in no acute distress.  Dermatology: Skin is warm, dry and supple bilateral lower extremities.   Vascular: Palpable pedal pulses bilaterally. Capillary refill within normal limits.  No appreciable edema.  No erythema.  Neurological: Grossly intact via light touch  Musculoskeletal Exam: No pedal deformities noted.  Tenderness with palpation noted along the lateral aspect of the left forefoot  Radiographic  Exam LT foot 07/01/2023:  Normal osseous mineralization. Joint spaces preserved.  No fractures or osseous irregularities noted. Impression: negatvie  Assessment/Plan of Care: 1.  Tendinitis lateral aspect of the left forefoot  -Patient evaluated.  X-rays reviewed -Injection of 0.5 cc Celestone Soluspan injected into the lateral aspect of the foot -Prescription for meloxicam 15 mg daily -Continue good supportive shoes.  Advised against going barefoot -Return to clinic 4 weeks       Felecia Shelling, DPM [facility]  Dr. Felecia Shelling, DPM    [address] [place], [place] [address]                Office [phone]  Fax [phone]

## 2023-07-09 ENCOUNTER — Encounter: Payer: Self-pay | Admitting: Podiatry

## 2023-08-05 ENCOUNTER — Ambulatory Visit (INDEPENDENT_AMBULATORY_CARE_PROVIDER_SITE_OTHER): Payer: Medicare HMO

## 2023-08-05 ENCOUNTER — Encounter: Payer: Self-pay | Admitting: Podiatry

## 2023-08-05 ENCOUNTER — Ambulatory Visit: Payer: Medicare HMO | Admitting: Podiatry

## 2023-08-05 VITALS — BP 139/84 | HR 83

## 2023-08-05 DIAGNOSIS — D2372 Other benign neoplasm of skin of left lower limb, including hip: Secondary | ICD-10-CM

## 2023-08-05 DIAGNOSIS — M7752 Other enthesopathy of left foot: Secondary | ICD-10-CM

## 2023-08-05 NOTE — Progress Notes (Signed)
   Chief Complaint  Patient presents with   Foot Pain    "I am still having pain.  We need to see what else we can do."    Subjective: 67 y.o. male presenting to the office today for continued pain and tenderness to the lateral aspect of the left forefoot.  The pain around the injection that was administered last month has resolved but he continues to have pain up to the forefoot.  Presenting for further treatment evaluation   Past Medical History:  Diagnosis Date   Complication of anesthesia    Diabetes mellitus without complication (HCC)    Herniated disc, cervical    HTN (hypertension)    Kidney stones    PONV (postoperative nausea and vomiting)     Past Surgical History:  Procedure Laterality Date   BACK SURGERY  2008    COLONOSCOPY WITH PROPOFOL N/A 07/12/2016   Procedure: COLONOSCOPY WITH PROPOFOL;  Surgeon: Christena Deem, MD;  Location: [facility] ENDOSCOPY;  Service: Endoscopy;  Laterality: N/A;   KIDNEY STONE SURGERY     had stent placed   LUMBAR LAMINECTOMY/DECOMPRESSION MICRODISCECTOMY N/A 06/30/2014   Procedure: LUMBAR FIVE- SACRAL ONE LAMINECTOMY/DECOMPRESSION MICRODISCECTOMY ;  Surgeon: Coletta Memos, MD;  Location: [facility] NEURO ORS;  Service: Neurosurgery;  Laterality: N/A;  Right redo L5S1 microdiskectomy   SPINE SURGERY  2008   L5 herniated disk repair, [place]   TONSILLECTOMY      Allergies  Allergen Reactions   Benazepril Cough     Objective:  Physical Exam General: Alert and oriented x3 in no acute distress  Dermatology: Hyperkeratotic lesion(s) present on the plantar aspect of the fifth MTP left. Pain on palpation with a central nucleated core noted. Skin is warm, dry and supple bilateral lower extremities. Negative for open lesions or macerations.  Vascular: Palpable pedal pulses bilaterally. No edema or erythema noted. Capillary refill within normal limits.  Neurological: Grossly intact via light touch  Musculoskeletal Exam: Pain on palpation at the  keratotic lesion(s) noted. Range of motion within normal limits bilateral. Muscle strength 5/5 in all groups bilateral.  Assessment: 1.  Symptomatic benign skin lesion plantar aspect fifth MTP left   Plan of Care:  -Patient evaluated -Excisional debridement of keratoic lesion(s) using a chisel blade was performed without incident.  -Salicylic acid applied with a bandaid -Patient felt almost instant relief with debridement of the lesion.  Advised against going barefoot. -Return to the clinic PRN.   Felecia Shelling, DPM [facility]  Dr. Felecia Shelling, DPM    [address] [place], [place] [address]                Office [phone]  Fax [phone]

## 2023-08-18 ENCOUNTER — Ambulatory Visit: Payer: Medicare HMO | Admitting: Podiatry

## 2023-08-19 ENCOUNTER — Ambulatory Visit: Payer: Medicare HMO | Admitting: Podiatry

## 2023-10-23 ENCOUNTER — Other Ambulatory Visit: Payer: Self-pay | Admitting: Podiatry

## 2024-03-21 ENCOUNTER — Ambulatory Visit: Admission: EM | Admit: 2024-03-21 | Discharge: 2024-03-21 | Disposition: A | Discharge status: Home / Self Care

## 2024-03-21 DIAGNOSIS — J069 Acute upper respiratory infection, unspecified: Secondary | ICD-10-CM | POA: Diagnosis not present

## 2024-03-21 MED ORDER — BENZONATATE 100 MG PO CAPS: 100.0000 mg | ORAL_CAPSULE | Freq: Three times a day (TID) | ORAL | 0 refills | Status: AC

## 2024-03-21 MED ORDER — AMOXICILLIN-POT CLAVULANATE 875-125 MG PO TABS: 1.0000 | ORAL_TABLET | Freq: Two times a day (BID) | ORAL | 0 refills | Status: AC

## 2024-03-21 MED ORDER — GUAIFENESIN-CODEINE 100-10 MG/5ML PO SOLN: 5.0000 mL | Freq: Four times a day (QID) | ORAL | 0 refills | Status: DC | PRN

## 2024-03-21 NOTE — ED Triage Notes (Signed)
 Pt c/o cough,congestion & fatigue x1 wk. Has tried robitussin & tylenol  w/o relief.

## 2024-03-21 NOTE — ED Provider Notes (Signed)
 [facility]    CSN: [id number] Arrival date & time: 03/21/24  0827      History   Chief Complaint Chief Complaint  Patient presents with   Cough   Nasal Congestion   Fatigue    HPI Ricky Heath is a 68 y.o. male.   Patient presents for evaluation of increased fatigue, nasal congestion, productive cough with green-brown sputum, sore throat from coughing and difficulty sleeping for the past 7 days.  No known sick contacts prior.  Tolerating food and liquids.  Has attempted use of Robitussin, Claritin and Tylenol .  Denies fever, shortness of breath or wheezing.  Past Medical History:  Diagnosis Date   Complication of anesthesia    Diabetes mellitus without complication (HCC)    Herniated disc, cervical    HTN (hypertension)    Kidney stones    PONV (postoperative nausea and vomiting)     Patient Active Problem List   Diagnosis Date Noted   Diabetes mellitus, type 2 (HCC) 04/12/2016   Sinus tachycardia 07/13/2014   Routine adult health maintenance 07/13/2014   HNP (herniated nucleus pulposus), lumbar 06/30/2014   Hydronephrosis 05/24/2013   Hypertrophy of prostate with urinary obstruction and other lower urinary tract symptoms (LUTS) 05/24/2013   Kidney stone 05/24/2013   Renal colic 05/24/2013   Obesity (BMI 30-39.9) 03/18/2012   Cough 03/17/2012   Hypertension 03/17/2012   Essential hypertension 12/29/2009    Past Surgical History:  Procedure Laterality Date   BACK SURGERY  2008    COLONOSCOPY WITH PROPOFOL  N/A 07/12/2016   Procedure: COLONOSCOPY WITH PROPOFOL ;  Surgeon: Deveron Fly, MD;  Location: [facility] ENDOSCOPY;  Service: Endoscopy;  Laterality: N/A;   KIDNEY STONE SURGERY     had stent placed   LUMBAR LAMINECTOMY/DECOMPRESSION MICRODISCECTOMY N/A 06/30/2014   Procedure: LUMBAR FIVE- SACRAL ONE LAMINECTOMY/DECOMPRESSION MICRODISCECTOMY ;  Surgeon: Audie Bleacher, MD;  Location: [facility] NEURO ORS;  Service: Neurosurgery;  Laterality: N/A;  Right redo  L5S1 microdiskectomy   SPINE SURGERY  2008   L5 herniated disk repair, [place]   TONSILLECTOMY         Home Medications    Prior to Admission medications   Medication Sig Start Date End Date Taking? Authorizing Provider  amoxicillin-clavulanate (AUGMENTIN) 875-125 MG tablet Take 1 tablet by mouth every 12 (twelve) hours. 03/21/24  Yes Zilda No R, NP  benzonatate  (TESSALON ) 100 MG capsule Take 1 capsule (100 mg total) by mouth every 8 (eight) hours. 03/21/24  Yes Sade Mehlhoff, Maybelle Spatz, NP  diclofenac (VOLTAREN) 75 MG EC tablet Take 1 tablet (75 mg total) by mouth 2 (two) times daily with meals for 30 days 03/09/24 04/08/24 Yes [provider]  guaiFENesin-codeine 100-10 MG/5ML syrup Take 5 mLs by mouth every 6 (six) hours as needed for cough. 03/21/24  Yes Gavyn Zoss, Maybelle Spatz, NP  rosuvastatin (CRESTOR) 5 MG tablet Take 1 tablet (5 mg total) by mouth once daily for 30 days 03/09/24 04/08/24 Yes [provider]  albuterol  (PROVENTIL  HFA;VENTOLIN  HFA) 108 (90 Base) MCG/ACT inhaler Inhale 1-2 puffs into the lungs every 6 (six) hours as needed for wheezing or shortness of breath. Use with spacer 10/18/17  Yes Georgiann Kirsch, PA-C  ALPRAZolam (XANAX) 1 MG tablet Take 1 mg by mouth. 06/16/13  Yes [provider]  amLODipine  (NORVASC ) 5 MG tablet Take 1 tablet (5 mg total) by mouth daily. 07/20/14  Yes Gollan, Timothy J, MD  Blood Glucose Monitoring Suppl (FIFTY50 GLUCOSE METER 2.0) w/Device KIT Use  three times daily to test blood glucose as directed 08/10/19   [provider]  cetirizine (ZYRTEC) 10 MG tablet Take by mouth.   Yes [provider]  chlorpheniramine-HYDROcodone  (TUSSIONEX PENNKINETIC ER) 10-8 MG/5ML SUER Take 5 mLs by mouth 2 (two) times daily. 10/18/17   Georgiann Kirsch, PA-C  Efinaconazole (JUBLIA) 10 % SOLN Apply topically.   Yes [provider]  EPINEPHrine  0.3 mg/0.3 mL IJ SOAJ injection as needed. 01/09/22  Yes [provider]  gentamicin  cream (GARAMYCIN ) 0.1 % Apply 1 application topically 2 (two) times daily. 03/03/20  Yes Dot Gazella, DPM  glucose blood (PRECISION QID TEST) test strip Use 1 each (1 strip total) 3 (three) times daily for 90 days 08/10/19   [provider]  HYDROcodone -acetaminophen  (NORCO/VICODIN) 5-325 MG tablet Take by mouth. Patient not taking: Reported on 08/05/2023 06/01/13   [provider]  ibuprofen (ADVIL) 200 MG tablet Take by mouth. Patient not taking: Reported on 08/05/2023    [provider]  JARDIANCE 25 MG TABS tablet Take 25 mg by mouth daily.   Yes [provider]  losartan  (COZAAR ) 100 MG tablet Take by mouth. 05/19/17  Yes [provider]  losartan -hydrochlorothiazide (HYZAAR) 100-25 MG per tablet Take 1 tablet by mouth daily. 07/13/14   Gollan, Timothy J, MD  metFORMIN (GLUCOPHAGE) 500 MG tablet Take by mouth 2 (two) times daily with a meal.   Yes [provider]  omeprazole  (PRILOSEC) 40 MG capsule Take 1 capsule (40 mg total) by mouth daily. 03/17/12 10/18/17  Thersia Flax, MD  ondansetron  (ZOFRAN ) 4 MG tablet Take 4 mg by mouth. Patient not taking: Reported on 08/05/2023 06/01/13   [provider]  pioglitazone (ACTOS) 30 MG tablet Take 30 mg by mouth daily.   Yes [provider]  predniSONE  (DELTASONE ) 20 MG tablet Take 2 tablets (40 mg) daily by mouth Patient not taking: Reported on 08/05/2023 10/18/17   Georgiann Kirsch, PA-C  tamsulosin  (FLOMAX ) 0.4 MG CAPS capsule  05/24/13  Yes [provider]  traZODone (DESYREL) 50 MG tablet Take 50 mg by mouth at bedtime. Patient not taking: Reported on 08/05/2023    [provider]    Family History Family History  Adopted: Yes    Social History Social History   Tobacco Use   Smoking status: Never   Smokeless tobacco: Never  Vaping Use   Vaping status: Never Used  Substance Use Topics   Alcohol use: No   Drug use: No      Allergies   Benazepril   Review of Systems Review of Systems   Physical Exam Triage Vital Signs ED Triage Vitals  Encounter Vitals Group     BP 03/21/24 0833 132/80     Systolic BP Percentile --      Diastolic BP Percentile --      Pulse Rate 03/21/24 0833 (!) 118     Resp 03/21/24 0833 16     Temp 03/21/24 0833 99.4 F (37.4 C)     Temp Source 03/21/24 0833 Oral     SpO2 03/21/24 0833 96 %     Weight 03/21/24 0832 210 lb (95.3 kg)     Height 03/21/24 0832 5\' 7"  (1.702 m)     Head Circumference --      Peak Flow --      Pain Score 03/21/24 0836 0     Pain Loc --      Pain Education --  Exclude from Growth Chart --    No data found.  Updated Vital Signs BP 132/80 (BP Location: Left Arm)   Pulse (!) 118   Temp 99.4 F (37.4 C) (Oral)   Resp 16   Ht 5\' 7"  (1.702 m)   Wt 210 lb (95.3 kg)   SpO2 96%   BMI 32.89 kg/m   Visual Acuity Right Eye Distance:   Left Eye Distance:   Bilateral Distance:    Right Eye Near:   Left Eye Near:    Bilateral Near:     Physical Exam Constitutional:      Appearance: Normal appearance.  HENT:     Right Ear: Tympanic membrane, ear canal and external ear normal.     Left Ear: Tympanic membrane, ear canal and external ear normal.     Nose: Congestion present.     Mouth/Throat:     Mouth: Mucous membranes are moist.     Pharynx: Oropharynx is clear.  Eyes:     Extraocular Movements: Extraocular movements intact.  Cardiovascular:     Rate and Rhythm: Normal rate and regular rhythm.     Pulses: Normal pulses.     Heart sounds: Normal heart sounds.  Pulmonary:     Effort: Pulmonary effort is normal.     Breath sounds: Normal breath sounds.  Musculoskeletal:     Cervical back: Normal range of motion and neck supple.  Neurological:     Mental Status: He is alert and oriented to person, place, and time. Mental status is at baseline.      UC Treatments / Results  Labs (all labs ordered are listed, but only  abnormal results are displayed) Labs Reviewed - No data to display  EKG   Radiology No results found.  Procedures Procedures (including critical care time)  Medications Ordered in UC Medications - No data to display  Initial Impression / Assessment and Plan / UC Course  I have reviewed the triage vital signs and the nursing notes.  Pertinent labs & imaging results that were available during my care of the patient were reviewed by me and considered in my medical decision making (see chart for details).  Acute URI  Patient is in no signs of distress nor toxic appearing.  Vital signs are stable.  Low suspicion for pneumonia, pneumothorax or bronchitis and therefore will defer imaging.  Prescribed Augmentin, Tessalon  and guaifenesin codeine, PDMP reviewed, low risk.May use additional over-the-counter medications as needed for supportive care.  May follow-up with urgent care as needed if symptoms persist or worsen.   Final Clinical Impressions(s) / UC Diagnoses   Final diagnoses:  Acute URI   Discharge Instructions      Begin Augmentin twice daily for 7 days  You may use Tessalon  pill every 8 hours for your cough, may use cough syrup every 6 hours for additional comfort, be mindful this will make you feel sleepy    You can take Tylenol  and/or Ibuprofen as needed for fever reduction and pain relief.   For cough: honey 1/2 to 1 teaspoon (you can dilute the honey in water or another fluid).  You can also use guaifenesin and dextromethorphan for cough. You can use a humidifier for chest congestion and cough.  If you don't have a humidifier, you can sit in the bathroom with the hot shower running.      For sore throat: try warm salt water gargles, cepacol lozenges, throat spray, warm tea or water with lemon/honey, popsicles or ice,  or OTC cold relief medicine for throat discomfort.   For congestion: take a daily anti-histamine like Zyrtec, Claritin, and a oral decongestant, such as  pseudoephedrine.  You can also use Flonase 1-2 sprays in each nostril daily.   It is important to stay hydrated: drink plenty of fluids (water, gatorade/powerade/pedialyte, juices, or teas) to keep your throat moisturized and help further relieve irritation/discomfort.   ED Prescriptions     Medication Sig Dispense Auth. Provider   amoxicillin-clavulanate (AUGMENTIN) 875-125 MG tablet Take 1 tablet by mouth every 12 (twelve) hours. 14 tablet Nikie Cid R, NP   benzonatate  (TESSALON ) 100 MG capsule Take 1 capsule (100 mg total) by mouth every 8 (eight) hours. 21 capsule Zenovia Justman R, NP   guaiFENesin-codeine 100-10 MG/5ML syrup Take 5 mLs by mouth every 6 (six) hours as needed for cough. 120 mL Jahmil Macleod, Maybelle Spatz, NP      I have reviewed the PDMP during this encounter.   Reena Canning, [place] 03/21/24 [phone]

## 2024-03-21 NOTE — Discharge Instructions (Signed)
 Begin Augmentin twice daily for 7 days  You may use Tessalon  pill every 8 hours for your cough, may use cough syrup every 6 hours for additional comfort, be mindful this will make you feel sleepy    You can take Tylenol  and/or Ibuprofen as needed for fever reduction and pain relief.   For cough: honey 1/2 to 1 teaspoon (you can dilute the honey in water or another fluid).  You can also use guaifenesin and dextromethorphan for cough. You can use a humidifier for chest congestion and cough.  If you don't have a humidifier, you can sit in the bathroom with the hot shower running.      For sore throat: try warm salt water gargles, cepacol lozenges, throat spray, warm tea or water with lemon/honey, popsicles or ice, or OTC cold relief medicine for throat discomfort.   For congestion: take a daily anti-histamine like Zyrtec, Claritin, and a oral decongestant, such as pseudoephedrine.  You can also use Flonase 1-2 sprays in each nostril daily.   It is important to stay hydrated: drink plenty of fluids (water, gatorade/powerade/pedialyte, juices, or teas) to keep your throat moisturized and help further relieve irritation/discomfort.

## 2024-05-12 ENCOUNTER — Other Ambulatory Visit: Payer: Self-pay | Admitting: Physical Medicine & Rehabilitation

## 2024-05-12 DIAGNOSIS — M5416 Radiculopathy, lumbar region: Secondary | ICD-10-CM

## 2024-05-13 ENCOUNTER — Encounter: Payer: Self-pay | Admitting: Physical Medicine & Rehabilitation

## 2024-05-24 ENCOUNTER — Ambulatory Visit
Admission: RE | Admit: 2024-05-24 | Discharge: 2024-05-24 | Disposition: A | Discharge status: Home / Self Care | Source: Ambulatory Visit | Attending: Physical Medicine & Rehabilitation | Admitting: Physical Medicine & Rehabilitation

## 2024-05-24 DIAGNOSIS — M5416 Radiculopathy, lumbar region: Secondary | ICD-10-CM

## 2024-05-24 MED ORDER — GADOPICLENOL 0.5 MMOL/ML IV SOLN
10.0000 mL | Freq: Once | INTRAVENOUS | Status: AC | PRN
  Administered 2024-05-24: 10 mL via INTRAVENOUS

## 2024-08-05 NOTE — Progress Notes (Signed)
 Referring Physician:  Dodson Delon FERNS, MD [address] [place],  [place] [address]  Primary Physician:  Don Lauraine Collar, NP  History of Present Illness: 08/12/2024 Mr. Ricky Heath is here today with a chief complaint of persistent right thigh pain.  He experiences back pain for three to four months, worsening with sitting and driving, and improving with walking. The pain is localized and does not radiate below the knee. Numbness and weakness occur after long drives, resolving shortly after exiting the vehicle. A sensation of a 'lump' is felt when sitting for extended periods.  He underwent back surgeries in 2008 and 2015. An MRI in 2022 showed no operable issues. Dry needling was previously effective but no longer provides relief. Physiotherapy and medications have been ineffective. Two epidural injections were given, but pain recurred by the follow-up.  Numbness is present on the right side and in the foot, attributed to past surgery. Tightness in the back of the leg occurs after activities like weeding, resolving after a while. No numbness or tingling below the knee. No issues with standing or walking up stairs.  Bowel/Bladder Dysfunction: none  Conservative measures:  Physical therapy: Has participated in [facility]. Multimodal medical therapy including regular antiinflammatories: gabapentin, meloxicam , tizanidine, diclofenac, ibuprofen, duloxetine  Injections:  07/12/2024 Right S1 TF ESI (initially 75% improvement) 04/26/2024 Right S1 TF ESI  (75% improvement)   Past Surgery:  Lumbar Surgery in 2008 06/30/14: L5-S1 Laminectomy by Dr. Gillie Lamar DELENA Schooling has no symptoms of cervical myelopathy.  The symptoms are causing a significant impact on the patient's life.   I have utilized the care everywhere function in epic to review the outside records available from external health systems.   Review of Systems:  A 10 point review of systems is  negative, except for the pertinent positives and negatives detailed in the HPI.  Past Medical History: Past Medical History:  Diagnosis Date   Complication of anesthesia    Diabetes mellitus without complication (HCC)    Herniated disc, cervical    HTN (hypertension)    Kidney stones    PONV (postoperative nausea and vomiting)     Past Surgical History: Past Surgical History:  Procedure Laterality Date   BACK SURGERY  2008    COLONOSCOPY WITH PROPOFOL  N/A 07/12/2016   Procedure: COLONOSCOPY WITH PROPOFOL ;  Surgeon: Gladis RAYMOND Mariner, MD;  Location: [facility] ENDOSCOPY;  Service: Endoscopy;  Laterality: N/A;   KIDNEY STONE SURGERY     had stent placed   LUMBAR LAMINECTOMY/DECOMPRESSION MICRODISCECTOMY N/A 06/30/2014   Procedure: LUMBAR FIVE- SACRAL ONE LAMINECTOMY/DECOMPRESSION MICRODISCECTOMY ;  Surgeon: Rockey Gillie, MD;  Location: [facility] NEURO ORS;  Service: Neurosurgery;  Laterality: N/A;  Right redo L5S1 microdiskectomy   SPINE SURGERY  2008   L5 herniated disk repair, [place]   TONSILLECTOMY      Allergies: Allergies as of 08/12/2024 - Review Complete 03/21/2024  Allergen Reaction Noted   Benazepril Cough 09/25/2016    Medications:  Current Outpatient Medications:    ALPRAZolam (XANAX) 1 MG tablet, Take 1 mg by mouth., Disp: , Rfl:    amLODipine  (NORVASC ) 5 MG tablet, Take 1 tablet (5 mg total) by mouth daily., Disp: 30 tablet, Rfl: 6   amoxicillin -clavulanate (AUGMENTIN ) 875-125 MG tablet, Take 1 tablet by mouth every 12 (twelve) hours., Disp: 14 tablet, Rfl: 0   benzonatate  (TESSALON ) 100 MG capsule, Take 1 capsule (100 mg total) by mouth every 8 (eight) hours., Disp: 21 capsule, Rfl: 0  Blood Glucose Monitoring Suppl (FIFTY50 GLUCOSE METER 2.0) w/Device KIT, Use three times daily to test blood glucose as directed, Disp: , Rfl:    cetirizine (ZYRTEC) 10 MG tablet, Take by mouth., Disp: , Rfl:    chlorpheniramine-HYDROcodone  (TUSSIONEX PENNKINETIC ER) 10-8 MG/5ML SUER, Take 5 mLs  by mouth 2 (two) times daily., Disp: 115 mL, Rfl: 0   Efinaconazole (JUBLIA) 10 % SOLN, Apply topically., Disp: , Rfl:    EPINEPHrine  0.3 mg/0.3 mL IJ SOAJ injection, as needed., Disp: , Rfl:    gentamicin  cream (GARAMYCIN ) 0.1 %, Apply 1 application topically 2 (two) times daily., Disp: 15 g, Rfl: 1   glucose blood (PRECISION QID TEST) test strip, Use 1 each (1 strip total) 3 (three) times daily for 90 days, Disp: , Rfl:    losartan  (COZAAR ) 100 MG tablet, Take by mouth., Disp: , Rfl:    metFORMIN (GLUCOPHAGE) 500 MG tablet, Take by mouth 2 (two) times daily with a meal., Disp: , Rfl:    omeprazole  (PRILOSEC) 40 MG capsule, Take 1 capsule (40 mg total) by mouth daily., Disp: 30 capsule, Rfl: 3   pioglitazone (ACTOS) 30 MG tablet, Take 30 mg by mouth daily., Disp: , Rfl:    tamsulosin  (FLOMAX ) 0.4 MG CAPS capsule, , Disp: , Rfl:   Current Facility-Administered Medications:    betamethasone  acetate-betamethasone  sodium phosphate  (CELESTONE ) injection 3 mg, 3 mg, Intra-articular, Once,   Social History: Social History   Tobacco Use   Smoking status: Never   Smokeless tobacco: Never  Vaping Use   Vaping status: Never Used  Substance Use Topics   Alcohol use: No   Drug use: No    Family Medical History: Family History  Adopted: Yes    Physical Examination: Vitals:   08/12/24 0853  BP: 126/84    General: Patient is in no apparent distress. Attention to examination is appropriate.  Neck:   Supple.  Full range of motion.  Respiratory: Patient is breathing without any difficulty.   NEUROLOGICAL:     Awake, alert, oriented to person, place, and time.  Speech is clear and fluent.   Cranial Nerves: Pupils equal round and reactive to light.  Facial tone is symmetric.  Facial sensation is symmetric. Shoulder shrug is symmetric. Tongue protrusion is midline.  There is no pronator drift.  Strength: Side Biceps Triceps Deltoid Interossei Grip Wrist Ext. Wrist Flex.  R 5 5 5 5 5 5  5   L 5 5 5 5 5 5 5    Side Iliopsoas Quads Hamstring PF DF EHL  R 5 5 5 5 5 5   L 5 5 5 5 5 5    Reflexes are 1+ and symmetric at the biceps, triceps, brachioradialis, patella and achilles.   Hoffman's is absent.   Bilateral upper and lower extremity sensation is intact to light touch.    No evidence of dysmetria noted.  Gait is normal.    Some posterior thigh tightness in R posterior thigh.   Medical Decision Making  Imaging: MRI L spine 05/24/2024 IMPRESSION: Prior right-sided laminotomy at L5-S1. No large recurrent disc herniation. Mild right neuroforaminal stenosis and mild spinal canal stenosis at this level.   No high-grade canal or foraminal stenoses at other levels.     Electronically Signed   By: Clem Savory M.D.   On: 05/28/2024 16:00  I have personally reviewed the images and agree with the above interpretation.  Assessment and Plan: Mr. Ricky Heath is a pleasant 68 y.o. male with  Right hamstring and proximal buttock pain, possible hamstring tendinopathy Chronic pain for 3-4 months, worsened by sitting and driving, relieved by walking. Previous treatments ineffective. MRI lumbar spine shows no surgical indication. Possible hamstring tendinopathy or soft tissue injury. No nerve compression or acute lumbar pathology on MRI. - Order MRI of the right hip to evaluate for possible hamstring tendinopathy or soft tissue injury. - Refer to orthopedics for further evaluation and management.  Lumbar spondylosis with mild degenerative changes Mild degenerative changes with no significant nerve compression or surgical indication. No acute changes on MRI. - No surgical intervention indicated.  I spent a total of 30 minutes in this patient's care today. This time was spent reviewing pertinent records including imaging studies, obtaining and confirming history, performing a directed evaluation, formulating and discussing my recommendations, and documenting the visit within the  medical record.       Thank you for involving me in the care of this patient.      Shyvonne Chastang K. Clois MD, [facility] Neurosurgery

## 2024-08-12 ENCOUNTER — Ambulatory Visit: Admitting: Neurosurgery

## 2024-08-12 ENCOUNTER — Encounter: Payer: Self-pay | Admitting: Neurosurgery

## 2024-08-12 VITALS — BP 126/84 | Ht 67.0 in | Wt 217.4 lb

## 2024-08-12 DIAGNOSIS — M79651 Pain in right thigh: Secondary | ICD-10-CM

## 2024-08-12 DIAGNOSIS — G8929 Other chronic pain: Secondary | ICD-10-CM | POA: Diagnosis not present

## 2024-08-12 DIAGNOSIS — M47816 Spondylosis without myelopathy or radiculopathy, lumbar region: Secondary | ICD-10-CM | POA: Diagnosis not present

## 2024-08-12 DIAGNOSIS — M25551 Pain in right hip: Secondary | ICD-10-CM | POA: Diagnosis not present

## 2024-08-16 ENCOUNTER — Encounter: Payer: Self-pay | Admitting: Neurosurgery

## 2024-08-20 ENCOUNTER — Ambulatory Visit
Admission: RE | Admit: 2024-08-20 | Discharge: 2024-08-20 | Disposition: A | Discharge status: Home / Self Care | Source: Ambulatory Visit | Attending: Neurosurgery | Admitting: Neurosurgery

## 2024-08-20 DIAGNOSIS — M79651 Pain in right thigh: Secondary | ICD-10-CM

## 2024-08-23 ENCOUNTER — Telehealth: Payer: Self-pay | Admitting: Neurosurgery

## 2024-08-23 DIAGNOSIS — M79651 Pain in right thigh: Secondary | ICD-10-CM

## 2024-08-23 NOTE — Telephone Encounter (Signed)
 Reviewed Dr Elliot last office visit note and referral was to be placed. Referral has been placed now

## 2024-08-23 NOTE — Telephone Encounter (Signed)
 Patient states he was supposed to have a referral placed for orthopedics, Krystal Doyne at [facility].  Please place referral order.

## 2024-08-30 ENCOUNTER — Encounter: Payer: Self-pay | Admitting: Neurosurgery

## 2024-09-14 ENCOUNTER — Encounter: Payer: Self-pay | Admitting: Podiatry

## 2024-09-14 ENCOUNTER — Ambulatory Visit

## 2024-09-14 ENCOUNTER — Ambulatory Visit: Admitting: Podiatry

## 2024-09-14 VITALS — Ht 67.0 in | Wt 217.4 lb

## 2024-09-14 DIAGNOSIS — Z0189 Encounter for other specified special examinations: Secondary | ICD-10-CM

## 2024-09-14 DIAGNOSIS — E119 Type 2 diabetes mellitus without complications: Secondary | ICD-10-CM

## 2024-09-14 NOTE — Progress Notes (Signed)
   Chief Complaint  Patient presents with   Diabetes    Pt is here for diabetic foot exam and believes he has a corn on the side of the right foot that needs some attention.    HPI: 68 y.o. male presenting today for routine diabetic foot exam  Past Medical History:  Diagnosis Date   Complication of anesthesia    Diabetes mellitus without complication (HCC)    Herniated disc, cervical    HTN (hypertension)    Kidney stones    PONV (postoperative nausea and vomiting)     Past Surgical History:  Procedure Laterality Date   BACK SURGERY  10/28/2006   COLONOSCOPY WITH PROPOFOL  N/A 07/12/2016   Procedure: COLONOSCOPY WITH PROPOFOL ;  Surgeon: Gladis RAYMOND Mariner, MD;  Location: [facility] ENDOSCOPY;  Service: Endoscopy;  Laterality: N/A;   KIDNEY STONE SURGERY     had stent placed   LUMBAR LAMINECTOMY/DECOMPRESSION MICRODISCECTOMY N/A 06/30/2014   Procedure: LUMBAR FIVE- SACRAL ONE LAMINECTOMY/DECOMPRESSION MICRODISCECTOMY ;  Surgeon: Rockey Peru, MD;  Location: [facility] NEURO ORS;  Service: Neurosurgery;  Laterality: N/A;  Right redo L5S1 microdiskectomy   SPINE SURGERY  10/28/2006   L5 herniated disk repair, [place]   TONSILLECTOMY      Allergies  Allergen Reactions   Benazepril Cough     Physical Exam: General: The patient is alert and oriented x3 in no acute distress.  Dermatology: Skin is warm, dry and supple bilateral lower extremities.   Vascular: Palpable pedal pulses bilaterally. Capillary refill within normal limits.  No appreciable edema.  No erythema.  Neurological: Grossly intact via light touch  Musculoskeletal Exam: No pedal deformities noted  Radiographic Exam:  Normal osseous mineralization. Joint spaces preserved.  No fractures or osseous irregularities noted.  Assessment/Plan of Care: 1.  Encounter for diabetic foot exam  -Patient evaluated. -Comprehensive diabetic foot exam performed today -Continue diabetic management with PCP -Continue wearing good  supportive tennis shoes and sneakers.  Refrain from going barefoot -Return to clinic annually       Thresa EMERSON Sar, DPM [facility]  Dr. Thresa EMERSON Sar, DPM    [address] [place], [place] [address]                Office [phone]  Fax [phone]

## 2025-09-13 ENCOUNTER — Ambulatory Visit: Admitting: Podiatry
